# Patient Record
Sex: Male | Born: 1935 | Race: Asian | Hispanic: No | Marital: Married | Smoking: Never smoker
Health system: Southern US, Community
[De-identification: ages and names within clinical notes are randomized; demographics above are authoritative.]

## PROBLEM LIST (undated history)

## (undated) DIAGNOSIS — E119 Type 2 diabetes mellitus without complications: Secondary | ICD-10-CM

## (undated) DIAGNOSIS — M1990 Unspecified osteoarthritis, unspecified site: Secondary | ICD-10-CM

## (undated) DIAGNOSIS — C801 Malignant (primary) neoplasm, unspecified: Secondary | ICD-10-CM

## (undated) DIAGNOSIS — G510 Bell's palsy: Secondary | ICD-10-CM

## (undated) DIAGNOSIS — I10 Essential (primary) hypertension: Secondary | ICD-10-CM

## (undated) DIAGNOSIS — H269 Unspecified cataract: Secondary | ICD-10-CM

## (undated) DIAGNOSIS — N189 Chronic kidney disease, unspecified: Secondary | ICD-10-CM

## (undated) DIAGNOSIS — E7800 Pure hypercholesterolemia, unspecified: Secondary | ICD-10-CM

## (undated) HISTORY — PX: COLONOSCOPY: SHX174

## (undated) HISTORY — PX: TONSILLECTOMY: SUR1361

## (undated) HISTORY — PX: EYE SURGERY: SHX253

## (undated) HISTORY — PX: TYMPANOPLASTY: SHX33

---

## 2003-11-07 ENCOUNTER — Encounter: Admission: RE | Admit: 2003-11-07 | Discharge: 2003-11-07 | Payer: Self-pay | Admitting: Family Medicine

## 2009-06-18 ENCOUNTER — Emergency Department (HOSPITAL_COMMUNITY): Admission: EM | Admit: 2009-06-18 | Discharge: 2009-06-18 | Payer: Self-pay | Admitting: Emergency Medicine

## 2009-08-30 ENCOUNTER — Ambulatory Visit (HOSPITAL_COMMUNITY): Admission: RE | Admit: 2009-08-30 | Discharge: 2009-08-30 | Payer: Self-pay | Admitting: Urology

## 2009-09-07 ENCOUNTER — Ambulatory Visit: Admission: RE | Admit: 2009-09-07 | Discharge: 2009-12-04 | Payer: Self-pay | Admitting: Radiation Oncology

## 2010-01-12 ENCOUNTER — Ambulatory Visit (HOSPITAL_BASED_OUTPATIENT_CLINIC_OR_DEPARTMENT_OTHER): Admission: RE | Admit: 2010-01-12 | Discharge: 2010-01-12 | Payer: Self-pay | Admitting: Urology

## 2010-02-02 ENCOUNTER — Ambulatory Visit
Admission: RE | Admit: 2010-02-02 | Discharge: 2010-05-03 | Payer: Self-pay | Source: Home / Self Care | Admitting: Radiation Oncology

## 2010-09-08 LAB — POCT I-STAT 4, (NA,K, GLUC, HGB,HCT)
Glucose, Bld: 122 mg/dL — ABNORMAL HIGH (ref 70–99)
HCT: 41 % (ref 39.0–52.0)
Hemoglobin: 13.9 g/dL (ref 13.0–17.0)
Potassium: 3.4 mEq/L — ABNORMAL LOW (ref 3.5–5.1)
Sodium: 144 mEq/L (ref 135–145)

## 2010-09-09 LAB — CBC
HCT: 37.4 % — ABNORMAL LOW (ref 39.0–52.0)
Hemoglobin: 13.1 g/dL (ref 13.0–17.0)
MCH: 34.2 pg — ABNORMAL HIGH (ref 26.0–34.0)
MCHC: 35 g/dL (ref 30.0–36.0)
MCV: 97.7 fL (ref 78.0–100.0)
Platelets: 154 10*3/uL (ref 150–400)
RBC: 3.84 MIL/uL — ABNORMAL LOW (ref 4.22–5.81)
RDW: 15.4 % (ref 11.5–15.5)
WBC: 4.8 10*3/uL (ref 4.0–10.5)

## 2010-09-09 LAB — COMPREHENSIVE METABOLIC PANEL
ALT: 19 U/L (ref 0–53)
AST: 18 U/L (ref 0–37)
Albumin: 4.1 g/dL (ref 3.5–5.2)
Alkaline Phosphatase: 69 U/L (ref 39–117)
BUN: 28 mg/dL — ABNORMAL HIGH (ref 6–23)
CO2: 29 mEq/L (ref 19–32)
Calcium: 10.3 mg/dL (ref 8.4–10.5)
Chloride: 106 mEq/L (ref 96–112)
Creatinine, Ser: 1.8 mg/dL — ABNORMAL HIGH (ref 0.4–1.5)
GFR calc Af Amer: 45 mL/min — ABNORMAL LOW (ref >60)
GFR calc non Af Amer: 37 mL/min — ABNORMAL LOW (ref >60)
Glucose, Bld: 187 mg/dL — ABNORMAL HIGH (ref 70–99)
Potassium: 3.6 mEq/L (ref 3.5–5.1)
Sodium: 142 mEq/L (ref 135–145)
Total Bilirubin: 0.9 mg/dL (ref 0.3–1.2)
Total Protein: 6.8 g/dL (ref 6.0–8.3)

## 2010-09-09 LAB — PROTIME-INR
INR: 0.97 (ref 0.00–1.49)
Prothrombin Time: 12.8 seconds (ref 11.6–15.2)

## 2010-09-09 LAB — APTT: aPTT: 35 seconds (ref 24–37)

## 2010-09-24 LAB — CBC
Hemoglobin: 15.3 g/dL (ref 13.0–17.0)
MCHC: 34.5 g/dL (ref 30.0–36.0)
MCV: 99.1 fL (ref 78.0–100.0)
RBC: 4.46 MIL/uL (ref 4.22–5.81)
RDW: 14.5 % (ref 11.5–15.5)

## 2010-09-24 LAB — COMPREHENSIVE METABOLIC PANEL
ALT: 39 U/L (ref 0–53)
AST: 74 U/L — ABNORMAL HIGH (ref 0–37)
Albumin: 4 g/dL (ref 3.5–5.2)
Alkaline Phosphatase: 107 U/L (ref 39–117)
BUN: 37 mg/dL — ABNORMAL HIGH (ref 6–23)
Calcium: 9.1 mg/dL (ref 8.4–10.5)
Chloride: 104 mEq/L (ref 96–112)
Creatinine, Ser: 1.53 mg/dL — ABNORMAL HIGH (ref 0.4–1.5)
Total Protein: 6.7 g/dL (ref 6.0–8.3)

## 2011-05-22 ENCOUNTER — Encounter: Payer: Self-pay | Admitting: *Deleted

## 2011-05-22 NOTE — Progress Notes (Unsigned)
PROGRESS RESULTS SCORE=10 IIEF SCORES= [id number] ON 09/07/2009

## 2013-10-25 ENCOUNTER — Other Ambulatory Visit: Payer: Self-pay | Admitting: Family Medicine

## 2013-10-25 DIAGNOSIS — M4310 Spondylolisthesis, site unspecified: Secondary | ICD-10-CM

## 2013-10-30 ENCOUNTER — Ambulatory Visit
Admission: RE | Admit: 2013-10-30 | Discharge: 2013-10-30 | Disposition: A | Discharge status: Home / Self Care | Payer: Medicare Other | Source: Ambulatory Visit | Attending: Family Medicine | Admitting: Family Medicine

## 2013-10-30 DIAGNOSIS — M4310 Spondylolisthesis, site unspecified: Secondary | ICD-10-CM

## 2013-12-30 ENCOUNTER — Other Ambulatory Visit: Payer: Self-pay | Admitting: Neurosurgery

## 2014-01-13 ENCOUNTER — Encounter (HOSPITAL_COMMUNITY): Payer: Self-pay

## 2014-01-17 ENCOUNTER — Ambulatory Visit (HOSPITAL_COMMUNITY)
Admission: RE | Admit: 2014-01-17 | Discharge: 2014-01-17 | Disposition: A | Discharge status: Home / Self Care | Payer: Medicare Other | Source: Ambulatory Visit | Attending: Neurosurgery | Admitting: Neurosurgery

## 2014-01-17 ENCOUNTER — Encounter (HOSPITAL_COMMUNITY)
Admission: RE | Admit: 2014-01-17 | Discharge: 2014-01-17 | Disposition: A | Discharge status: Home / Self Care | Payer: Medicare Other | Source: Ambulatory Visit | Attending: Anesthesiology | Admitting: Anesthesiology

## 2014-01-17 ENCOUNTER — Encounter (HOSPITAL_COMMUNITY): Payer: Self-pay

## 2014-01-17 DIAGNOSIS — G510 Bell's palsy: Secondary | ICD-10-CM | POA: Insufficient documentation

## 2014-01-17 DIAGNOSIS — Z8546 Personal history of malignant neoplasm of prostate: Secondary | ICD-10-CM | POA: Insufficient documentation

## 2014-01-17 DIAGNOSIS — E119 Type 2 diabetes mellitus without complications: Secondary | ICD-10-CM | POA: Insufficient documentation

## 2014-01-17 DIAGNOSIS — I10 Essential (primary) hypertension: Secondary | ICD-10-CM | POA: Insufficient documentation

## 2014-01-17 DIAGNOSIS — M129 Arthropathy, unspecified: Secondary | ICD-10-CM | POA: Diagnosis not present

## 2014-01-17 DIAGNOSIS — N289 Disorder of kidney and ureter, unspecified: Secondary | ICD-10-CM | POA: Diagnosis not present

## 2014-01-17 DIAGNOSIS — Z01810 Encounter for preprocedural cardiovascular examination: Secondary | ICD-10-CM | POA: Diagnosis present

## 2014-01-17 DIAGNOSIS — E7800 Pure hypercholesterolemia, unspecified: Secondary | ICD-10-CM | POA: Insufficient documentation

## 2014-01-17 HISTORY — DX: Type 2 diabetes mellitus without complications: E11.9

## 2014-01-17 HISTORY — DX: Unspecified osteoarthritis, unspecified site: M19.90

## 2014-01-17 HISTORY — DX: Pure hypercholesterolemia, unspecified: E78.00

## 2014-01-17 HISTORY — DX: Malignant (primary) neoplasm, unspecified: C80.1

## 2014-01-17 HISTORY — DX: Bell's palsy: G51.0

## 2014-01-17 HISTORY — DX: Unspecified cataract: H26.9

## 2014-01-17 HISTORY — DX: Essential (primary) hypertension: I10

## 2014-01-17 HISTORY — DX: Chronic kidney disease, unspecified: N18.9

## 2014-01-17 LAB — CBC
HCT: 44 % (ref 39.0–52.0)
HEMOGLOBIN: 15.1 g/dL (ref 13.0–17.0)
MCH: 33.3 pg (ref 26.0–34.0)
MCHC: 34.3 g/dL (ref 30.0–36.0)
MCV: 97.1 fL (ref 78.0–100.0)
Platelets: 175 10*3/uL (ref 150–400)
RBC: 4.53 MIL/uL (ref 4.22–5.81)
RDW: 13.8 % (ref 11.5–15.5)
WBC: 5.6 10*3/uL (ref 4.0–10.5)

## 2014-01-17 LAB — BASIC METABOLIC PANEL
Anion gap: 15 (ref 5–15)
BUN: 32 mg/dL — AB (ref 6–23)
CALCIUM: 10.5 mg/dL (ref 8.4–10.5)
CO2: 22 mEq/L (ref 19–32)
Chloride: 100 mEq/L (ref 96–112)
Creatinine, Ser: 1.83 mg/dL — ABNORMAL HIGH (ref 0.50–1.35)
GFR calc Af Amer: 39 mL/min — ABNORMAL LOW (ref >90)
GFR, EST NON AFRICAN AMERICAN: 33 mL/min — AB (ref >90)
GLUCOSE: 114 mg/dL — AB (ref 70–99)
Potassium: 4.3 mEq/L (ref 3.7–5.3)
SODIUM: 137 meq/L (ref 137–147)

## 2014-01-17 LAB — SURGICAL PCR SCREEN
MRSA, PCR: NEGATIVE
Staphylococcus aureus: NEGATIVE

## 2014-01-17 NOTE — Progress Notes (Signed)
Pt's PCP is Dr. Kathryne Eriksson with [facility] in [place]. He denies chest pain or sob, denies cardiac history.

## 2014-01-17 NOTE — Pre-Procedure Instructions (Signed)
Ricky Holden  01/17/2014   Your procedure is scheduled on:  Monday, January 24, 2014 at 11:55 AM.   Report to [facility] Entrance "A" Admitting Office at 10:00 AM.   Call this number if you have problems the morning of surgery: [phone]   Remember:   Do not eat food or drink liquids after midnight Sunday, 01/23/14.   Take these medicines the morning of surgery with A SIP OF WATER: allopurinol (ZYLOPRIM), atenolol-chlorthalidone (TENORETIC)  Stop Celebrex as of today.     Do not wear jewelry.  Do not wear lotions, powders, or cologne. You may wear deodorant.  Men may shave face and neck.  Do not bring valuables to the hospital.  [facility] is not responsible                  for any belongings or valuables.               Contacts, dentures or bridgework may not be worn into surgery.  Leave suitcase in the car. After surgery it may be brought to your room.  For patients admitted to the hospital, discharge time is determined by your                treatment team.             Special Instructions: [place] - Preparing for Surgery  Before surgery, you can play an important role.  Because skin is not sterile, your skin needs to be as free of germs as possible.  You can reduce the number of germs on you skin by washing with CHG (chlorahexidine gluconate) soap before surgery.  CHG is an antiseptic cleaner which kills germs and bonds with the skin to continue killing germs even after washing.  Please DO NOT use if you have an allergy to CHG or antibacterial soaps.  If your skin becomes reddened/irritated stop using the CHG and inform your nurse when you arrive at Short Stay.  Do not shave (including legs and underarms) for at least 48 hours prior to the first CHG shower.  You may shave your face.  Please follow these instructions carefully:   1.  Shower with CHG Soap the night before surgery and the                                morning of Surgery.  2.  If you choose  to wash your hair, wash your hair first as usual with your       normal shampoo.  3.  After you shampoo, rinse your hair and body thoroughly to remove the                      Shampoo.  4.  Use CHG as you would any other liquid soap.  You can apply chg directly       to the skin and wash gently with scrungie or a clean washcloth.  5.  Apply the CHG Soap to your body ONLY FROM THE NECK DOWN.        Do not use on open wounds or open sores.  Avoid contact with your eyes, ears, mouth and genitals (private parts).  Wash genitals (private parts) with your normal soap.  6.  Wash thoroughly, paying special attention to the area where your surgery        will be performed.  7.  Thoroughly rinse your body with warm water from the neck down.  8.  DO NOT shower/wash with your normal soap after using and rinsing off       the CHG Soap.  9.  Pat yourself dry with a clean towel.            10.  Wear clean pajamas.            11.  Place clean sheets on your bed the night of your first shower and do not        sleep with pets.  Day of Surgery  Do not apply any lotions the morning of surgery.  Please wear clean clothes to the hospital/surgery center.     Please read over the following fact sheets that you were given: Pain Booklet, Coughing and Deep Breathing, MRSA Information and Surgical Site Infection Prevention

## 2014-01-17 NOTE — Progress Notes (Signed)
01/17/14 0950  OBSTRUCTIVE SLEEP APNEA  Have you ever been diagnosed with sleep apnea through a sleep study? No  Do you snore loudly (loud enough to be heard through closed doors)?  0  Do you often feel tired, fatigued, or sleepy during the daytime? 0  Has anyone observed you stop breathing during your sleep? 0  Do you have, or are you being treated for high blood pressure? 1  BMI more than 35 kg/m2? 0  Age over 78 years old? 1  Neck circumference greater than 40 cm/16 inches? 1  Gender: 1  Obstructive Sleep Apnea Score 4  Score 4 or greater  Results sent to PCP

## 2014-01-18 NOTE — Progress Notes (Addendum)
Anesthesia Chart Review:  Patient is a 80 year old male scheduled for C3-4, C4-5, C5-6 ACDF on 01/24/14 by Dr. Arnoldo Morale.  History includes non-smoker, HTN, DM2, hypercholesterolemia, left cataract extraction, arthritis, tonsillectomy, prostate cancer s/p radioactive seed implant 01/12/10 (Dr. Karsten Ro), Bell's Palsy. He has evidence of renal insuffiency labs dating back to at least 12/2009. OSA screening score is 4. PCP is Dr. Kathryne Eriksson with [facility].    EKG on 01/17/14 showed: NSR, LAD.  CXR on 01/17/14 showed: No acute abnormality noted.   Preoperative labs noted.  BUN/Cr 32/1.83.  Currently his last comparison labs are from 12/22/09 prior to his prostate surgery with BUN/Cr 28/1.80 at that time.  CBC WNL.  Glucose 114.  Based on currently available records, renal function appears stable. However, I've requested more recent comparison labs from Dr. Dois Davenport office. I've asked nursing staff to bring me these records once received. If labs appear stable then I would anticipate that he could proceed as planned.   George Hugh [facility] Short Stay Center/Anesthesiology Phone [phone] 01/18/2014 11:28 AM  Addendum: 01/19/2014 4:32 PM Comparison labs received from Dr. Dois Davenport office.  His last BUN/Cr there were 25/1.40 (specimen icteric-may interfere with results) on 03/31/13.  His active problem list by Dr. Redmond Pulling includes "disorder of kidney and ureter" but otherwise is non-specific regarding CKD history.  I did call and speak with Tammy at Dr. Dois Davenport office regarding patient's most recent BUN/Cr results. I also faxed the lab report with confirmation. Dr. Redmond Pulling is out of the office this week, but she will have one of the other practitioners review to ensure no additional recommendations.  For now, I'll order an ISTAT 8 to ensure no significant increase in his creatinine preoperatively.     Addendum: 01/21/2014 2:26 PM I had still not heard back from Dr. Dois Davenport office by this  morning, so I called again and refaxed labs. I then reviewed known information with anesthesiologist Dr. Marcie Bal who agreed that his PCP would need to sign off on labs and verify if this was a new or chronic issue.  I called back and spoke with nurse Pam who had spoken with Amy Boucherle, PA-C.  Amy had reviewed labs faxed and their office records dating back at least four years.  She said that during this time, his Cr has ranged between 1.4 - 2.0. His renal insufficiency has been a chronic issue, so no additional preoperative recommendations were made.

## 2014-01-21 ENCOUNTER — Encounter (HOSPITAL_COMMUNITY): Payer: Self-pay

## 2014-01-23 MED ORDER — CEFAZOLIN SODIUM-DEXTROSE 2-3 GM-% IV SOLR
2.0000 g | INTRAVENOUS | Status: AC
  Administered 2014-01-24: 2 g via INTRAVENOUS
  Filled 2014-01-23: qty 50

## 2014-01-24 ENCOUNTER — Inpatient Hospital Stay (HOSPITAL_COMMUNITY): Payer: Medicare Other | Admitting: Certified Registered Nurse Anesthetist

## 2014-01-24 ENCOUNTER — Encounter (HOSPITAL_COMMUNITY): Payer: Medicare Other | Admitting: Vascular Surgery

## 2014-01-24 ENCOUNTER — Inpatient Hospital Stay (HOSPITAL_COMMUNITY): Payer: Medicare Other

## 2014-01-24 ENCOUNTER — Encounter (HOSPITAL_COMMUNITY): Payer: Self-pay | Admitting: *Deleted

## 2014-01-24 ENCOUNTER — Encounter (HOSPITAL_COMMUNITY)
Admission: RE | Disposition: A | Discharge status: Home / Self Care | Payer: Self-pay | Source: Ambulatory Visit | Attending: Neurosurgery

## 2014-01-24 ENCOUNTER — Inpatient Hospital Stay (HOSPITAL_COMMUNITY)
Admission: RE | Admit: 2014-01-24 | Discharge: 2014-01-26 | DRG: 473 | Disposition: A | Discharge status: Home / Self Care | Payer: Medicare Other | Source: Ambulatory Visit | Attending: Neurosurgery | Admitting: Neurosurgery

## 2014-01-24 DIAGNOSIS — M4712 Other spondylosis with myelopathy, cervical region: Principal | ICD-10-CM | POA: Diagnosis present

## 2014-01-24 DIAGNOSIS — Q762 Congenital spondylolisthesis: Secondary | ICD-10-CM

## 2014-01-24 DIAGNOSIS — I129 Hypertensive chronic kidney disease with stage 1 through stage 4 chronic kidney disease, or unspecified chronic kidney disease: Secondary | ICD-10-CM | POA: Diagnosis present

## 2014-01-24 DIAGNOSIS — N189 Chronic kidney disease, unspecified: Secondary | ICD-10-CM | POA: Diagnosis present

## 2014-01-24 DIAGNOSIS — E7800 Pure hypercholesterolemia, unspecified: Secondary | ICD-10-CM | POA: Diagnosis present

## 2014-01-24 DIAGNOSIS — E119 Type 2 diabetes mellitus without complications: Secondary | ICD-10-CM | POA: Diagnosis present

## 2014-01-24 DIAGNOSIS — M542 Cervicalgia: Secondary | ICD-10-CM | POA: Diagnosis present

## 2014-01-24 DIAGNOSIS — Z800 Family history of malignant neoplasm of digestive organs: Secondary | ICD-10-CM

## 2014-01-24 HISTORY — PX: ANTERIOR CERVICAL DECOMP/DISCECTOMY FUSION: SHX1161

## 2014-01-24 LAB — POCT I-STAT, CHEM 8
BUN: 33 mg/dL — AB (ref 6–23)
CALCIUM ION: 1.33 mmol/L — AB (ref 1.13–1.30)
CHLORIDE: 102 meq/L (ref 96–112)
CREATININE: 1.8 mg/dL — AB (ref 0.50–1.35)
GLUCOSE: 115 mg/dL — AB (ref 70–99)
HCT: 46 % (ref 39.0–52.0)
Hemoglobin: 15.6 g/dL (ref 13.0–17.0)
Potassium: 4.1 mEq/L (ref 3.7–5.3)
Sodium: 140 mEq/L (ref 137–147)
TCO2: 23 mmol/L (ref 0–100)

## 2014-01-24 LAB — GLUCOSE, CAPILLARY
Glucose-Capillary: 184 mg/dL — ABNORMAL HIGH (ref 70–99)
Glucose-Capillary: 186 mg/dL — ABNORMAL HIGH (ref 70–99)

## 2014-01-24 SURGERY — ANTERIOR CERVICAL DECOMPRESSION/DISCECTOMY FUSION 3 LEVELS
Anesthesia: General

## 2014-01-24 MED ORDER — DOCUSATE SODIUM 100 MG PO CAPS
100.0000 mg | ORAL_CAPSULE | Freq: Two times a day (BID) | ORAL | Status: DC
  Administered 2014-01-25 – 2014-01-26 (×3): 100 mg via ORAL
  Filled 2014-01-24 (×4): qty 1

## 2014-01-24 MED ORDER — ATENOLOL-CHLORTHALIDONE 50-25 MG PO TABS: 0.5000 | ORAL_TABLET | Freq: Every day | ORAL | Status: DC

## 2014-01-24 MED ORDER — ALUM & MAG HYDROXIDE-SIMETH 200-200-20 MG/5ML PO SUSP
30.0000 mL | Freq: Four times a day (QID) | ORAL | Status: DC | PRN
Start: 2014-01-24 — End: 2014-01-26

## 2014-01-24 MED ORDER — ROCURONIUM BROMIDE 100 MG/10ML IV SOLN
INTRAVENOUS | Status: DC | PRN
  Administered 2014-01-24: 50 mg via INTRAVENOUS

## 2014-01-24 MED ORDER — FENOFIBRATE 160 MG PO TABS
160.0000 mg | ORAL_TABLET | Freq: Every day | ORAL | Status: DC
  Administered 2014-01-25 – 2014-01-26 (×2): 160 mg via ORAL
  Filled 2014-01-24 (×3): qty 1

## 2014-01-24 MED ORDER — ACETAMINOPHEN 650 MG RE SUPP: 650.0000 mg | RECTAL | Status: DC | PRN

## 2014-01-24 MED ORDER — ONDANSETRON HCL 4 MG/2ML IJ SOLN
INTRAMUSCULAR | Status: DC | PRN
  Administered 2014-01-24: 4 mg via INTRAVENOUS

## 2014-01-24 MED ORDER — CEFAZOLIN SODIUM-DEXTROSE 2-3 GM-% IV SOLR
2.0000 g | Freq: Three times a day (TID) | INTRAVENOUS | Status: AC
  Administered 2014-01-24 – 2014-01-25 (×2): 2 g via INTRAVENOUS
  Filled 2014-01-24 (×2): qty 50

## 2014-01-24 MED ORDER — ACETAMINOPHEN 325 MG PO TABS: 650.0000 mg | ORAL_TABLET | ORAL | Status: DC | PRN

## 2014-01-24 MED ORDER — BACITRACIN 50000 UNITS IM SOLR
INTRAMUSCULAR | Status: DC | PRN
  Administered 2014-01-24 (×2)

## 2014-01-24 MED ORDER — HYDROCODONE-ACETAMINOPHEN 5-325 MG PO TABS
1.0000 | ORAL_TABLET | ORAL | Status: DC | PRN
  Administered 2014-01-25: 1 via ORAL
  Filled 2014-01-24: qty 1

## 2014-01-24 MED ORDER — NEOSTIGMINE METHYLSULFATE 10 MG/10ML IV SOLN
INTRAVENOUS | Status: DC | PRN
  Administered 2014-01-24: 4 mg via INTRAVENOUS

## 2014-01-24 MED ORDER — BACITRACIN ZINC 500 UNIT/GM EX OINT
TOPICAL_OINTMENT | CUTANEOUS | Status: DC | PRN
  Administered 2014-01-24: 1 via TOPICAL

## 2014-01-24 MED ORDER — LACTATED RINGERS IV SOLN
INTRAVENOUS | Status: DC
  Administered 2014-01-24: 22:00:00 via INTRAVENOUS

## 2014-01-24 MED ORDER — HYDROMORPHONE HCL PF 1 MG/ML IJ SOLN: 0.2500 mg | INTRAMUSCULAR | Status: DC | PRN

## 2014-01-24 MED ORDER — ONDANSETRON HCL 4 MG/2ML IJ SOLN: 4.0000 mg | INTRAMUSCULAR | Status: DC | PRN

## 2014-01-24 MED ORDER — MIDAZOLAM HCL 5 MG/5ML IJ SOLN
INTRAMUSCULAR | Status: DC | PRN
  Administered 2014-01-24: 2 mg via INTRAVENOUS

## 2014-01-24 MED ORDER — ZOLPIDEM TARTRATE 5 MG PO TABS: 5.0000 mg | ORAL_TABLET | Freq: Every evening | ORAL | Status: DC | PRN

## 2014-01-24 MED ORDER — PROPOFOL 10 MG/ML IV BOLUS
INTRAVENOUS | Status: AC
  Filled 2014-01-24: qty 20

## 2014-01-24 MED ORDER — FENTANYL CITRATE 0.05 MG/ML IJ SOLN
INTRAMUSCULAR | Status: DC | PRN
  Administered 2014-01-24: 100 ug via INTRAVENOUS
  Administered 2014-01-24: 150 ug via INTRAVENOUS

## 2014-01-24 MED ORDER — ATORVASTATIN CALCIUM 10 MG PO TABS
20.0000 mg | ORAL_TABLET | Freq: Every day | ORAL | Status: DC
  Administered 2014-01-25 – 2014-01-26 (×2): 20 mg via ORAL
  Filled 2014-01-24: qty 1
  Filled 2014-01-24: qty 2
  Filled 2014-01-24: qty 1

## 2014-01-24 MED ORDER — DEXAMETHASONE SODIUM PHOSPHATE 10 MG/ML IJ SOLN
INTRAMUSCULAR | Status: DC | PRN
  Administered 2014-01-24: 10 mg via INTRAVENOUS

## 2014-01-24 MED ORDER — ALBUMIN HUMAN 5 % IV SOLN
INTRAVENOUS | Status: DC | PRN
  Administered 2014-01-24 (×2): via INTRAVENOUS

## 2014-01-24 MED ORDER — FENTANYL CITRATE 0.05 MG/ML IJ SOLN
INTRAMUSCULAR | Status: AC
  Filled 2014-01-24: qty 5

## 2014-01-24 MED ORDER — ROCURONIUM BROMIDE 50 MG/5ML IV SOLN
INTRAVENOUS | Status: AC
  Filled 2014-01-24: qty 1

## 2014-01-24 MED ORDER — LACTATED RINGERS IV SOLN: INTRAVENOUS | Status: DC | PRN

## 2014-01-24 MED ORDER — ONDANSETRON HCL 4 MG/2ML IJ SOLN: 4.0000 mg | Freq: Four times a day (QID) | INTRAMUSCULAR | Status: DC | PRN

## 2014-01-24 MED ORDER — OXYCODONE HCL 5 MG/5ML PO SOLN: 5.0000 mg | Freq: Once | ORAL | Status: DC | PRN

## 2014-01-24 MED ORDER — BUPIVACAINE-EPINEPHRINE (PF) 0.5% -1:200000 IJ SOLN
INTRAMUSCULAR | Status: DC | PRN
  Administered 2014-01-24: 10 mL

## 2014-01-24 MED ORDER — PHENYLEPHRINE HCL 10 MG/ML IJ SOLN
INTRAMUSCULAR | Status: DC | PRN
  Administered 2014-01-24: 40 ug via INTRAVENOUS
  Administered 2014-01-24 (×6): 80 ug via INTRAVENOUS

## 2014-01-24 MED ORDER — DIAZEPAM 5 MG PO TABS
5.0000 mg | ORAL_TABLET | Freq: Four times a day (QID) | ORAL | Status: DC | PRN
  Administered 2014-01-25: 5 mg via ORAL
  Filled 2014-01-24: qty 1

## 2014-01-24 MED ORDER — PHENOL 1.4 % MT LIQD
1.0000 | OROMUCOSAL | Status: DC | PRN
Start: 2014-01-24 — End: 2014-01-26

## 2014-01-24 MED ORDER — ALLOPURINOL 100 MG PO TABS
300.0000 mg | ORAL_TABLET | Freq: Every day | ORAL | Status: DC
  Administered 2014-01-25 – 2014-01-26 (×2): 300 mg via ORAL
  Filled 2014-01-24: qty 1
  Filled 2014-01-24: qty 3

## 2014-01-24 MED ORDER — SURGIFOAM 100 EX MISC
CUTANEOUS | Status: DC | PRN
  Administered 2014-01-24 (×2): via TOPICAL

## 2014-01-24 MED ORDER — ONDANSETRON HCL 4 MG/2ML IJ SOLN
INTRAMUSCULAR | Status: AC
  Filled 2014-01-24: qty 2

## 2014-01-24 MED ORDER — OXYCODONE-ACETAMINOPHEN 5-325 MG PO TABS
1.0000 | ORAL_TABLET | ORAL | Status: DC | PRN
  Administered 2014-01-25: 2 via ORAL
  Administered 2014-01-26: 1 via ORAL
  Filled 2014-01-24: qty 1
  Filled 2014-01-24: qty 2

## 2014-01-24 MED ORDER — PROPOFOL 10 MG/ML IV BOLUS
INTRAVENOUS | Status: DC | PRN
  Administered 2014-01-24: 150 mg via INTRAVENOUS

## 2014-01-24 MED ORDER — DEXAMETHASONE 4 MG PO TABS
4.0000 mg | ORAL_TABLET | Freq: Four times a day (QID) | ORAL | Status: AC
  Administered 2014-01-25: 4 mg via ORAL
  Filled 2014-01-24 (×4): qty 1

## 2014-01-24 MED ORDER — MIDAZOLAM HCL 2 MG/2ML IJ SOLN
INTRAMUSCULAR | Status: AC
  Filled 2014-01-24: qty 2

## 2014-01-24 MED ORDER — LIDOCAINE HCL (CARDIAC) 20 MG/ML IV SOLN
INTRAVENOUS | Status: DC | PRN
  Administered 2014-01-24: 80 mg via INTRAVENOUS

## 2014-01-24 MED ORDER — GLYCOPYRROLATE 0.2 MG/ML IJ SOLN
INTRAMUSCULAR | Status: DC | PRN
  Administered 2014-01-24: .8 mg via INTRAVENOUS

## 2014-01-24 MED ORDER — LIDOCAINE HCL (CARDIAC) 20 MG/ML IV SOLN
INTRAVENOUS | Status: AC
  Filled 2014-01-24: qty 5

## 2014-01-24 MED ORDER — EPHEDRINE SULFATE 50 MG/ML IJ SOLN
INTRAMUSCULAR | Status: DC | PRN
  Administered 2014-01-24 (×3): 10 mg via INTRAVENOUS

## 2014-01-24 MED ORDER — ATENOLOL 25 MG PO TABS
25.0000 mg | ORAL_TABLET | Freq: Every day | ORAL | Status: DC
  Administered 2014-01-25: 25 mg via ORAL
  Filled 2014-01-24 (×2): qty 1

## 2014-01-24 MED ORDER — MENTHOL 3 MG MT LOZG
1.0000 | LOZENGE | OROMUCOSAL | Status: DC | PRN
  Filled 2014-01-24: qty 9

## 2014-01-24 MED ORDER — OXYCODONE HCL 5 MG PO TABS: 5.0000 mg | ORAL_TABLET | Freq: Once | ORAL | Status: DC | PRN

## 2014-01-24 MED ORDER — 0.9 % SODIUM CHLORIDE (POUR BTL) OPTIME
TOPICAL | Status: DC | PRN
  Administered 2014-01-24: 1000 mL

## 2014-01-24 MED ORDER — LINAGLIPTIN 5 MG PO TABS
5.0000 mg | ORAL_TABLET | Freq: Every day | ORAL | Status: DC
  Administered 2014-01-25 – 2014-01-26 (×2): 5 mg via ORAL
  Filled 2014-01-24 (×3): qty 1

## 2014-01-24 MED ORDER — MORPHINE SULFATE 2 MG/ML IJ SOLN
1.0000 mg | INTRAMUSCULAR | Status: DC | PRN
  Administered 2014-01-24 – 2014-01-25 (×2): 2 mg via INTRAVENOUS
  Filled 2014-01-24 (×3): qty 1

## 2014-01-24 MED ORDER — HYDROCHLOROTHIAZIDE 12.5 MG PO CAPS
12.5000 mg | ORAL_CAPSULE | Freq: Every day | ORAL | Status: DC
  Administered 2014-01-25: 12.5 mg via ORAL
  Filled 2014-01-24 (×2): qty 1

## 2014-01-24 MED ORDER — INSULIN ASPART 100 UNIT/ML ~~LOC~~ SOLN
0.0000 [IU] | SUBCUTANEOUS | Status: DC
  Administered 2014-01-24 – 2014-01-25 (×3): 4 [IU] via SUBCUTANEOUS
  Administered 2014-01-25: 3 [IU] via SUBCUTANEOUS

## 2014-01-24 MED ORDER — SODIUM CHLORIDE 0.9 % IV SOLN
INTRAVENOUS | Status: DC
  Administered 2014-01-24 (×4): via INTRAVENOUS

## 2014-01-24 MED ORDER — DEXAMETHASONE SODIUM PHOSPHATE 4 MG/ML IJ SOLN
4.0000 mg | Freq: Four times a day (QID) | INTRAMUSCULAR | Status: AC
  Administered 2014-01-25 (×2): 4 mg via INTRAVENOUS
  Filled 2014-01-24 (×3): qty 1

## 2014-01-24 MED ORDER — ROPINIROLE HCL 1 MG PO TABS
0.5000 mg | ORAL_TABLET | Freq: Every day | ORAL | Status: DC
  Administered 2014-01-25: 0.5 mg via ORAL
  Filled 2014-01-24 (×2): qty 1
  Filled 2014-01-24: qty 0.5

## 2014-01-24 SURGICAL SUPPLY — 70 items
BAG DECANTER FOR FLEXI CONT (MISCELLANEOUS) ×3 IMPLANT
BENZOIN TINCTURE PRP APPL 2/3 (GAUZE/BANDAGES/DRESSINGS) ×3 IMPLANT
BIT DRILL NEURO 2X3.1 SFT TUCH (MISCELLANEOUS) ×1 IMPLANT
BLADE 10 SAFETY STRL DISP (BLADE) ×3 IMPLANT
BLADE SURG 15 STRL LF DISP TIS (BLADE) ×1 IMPLANT
BLADE SURG 15 STRL SS (BLADE) ×2
BLADE ULTRA TIP 2M (BLADE) ×3 IMPLANT
BRUSH SCRUB EZ PLAIN DRY (MISCELLANEOUS) ×3 IMPLANT
BUR BARREL STRAIGHT FLUTE 4.0 (BURR) ×3 IMPLANT
BUR MATCHSTICK NEURO 3.0 LAGG (BURR) ×3 IMPLANT
CANISTER SUCT 3000ML (MISCELLANEOUS) ×3 IMPLANT
CLOSURE WOUND 1/2 X4 (GAUZE/BANDAGES/DRESSINGS) ×1
CONT SPEC 4OZ CLIKSEAL STRL BL (MISCELLANEOUS) ×3 IMPLANT
COVER MAYO STAND STRL (DRAPES) ×3 IMPLANT
DEVICE FUSION VIST S 14X14X6MM (Trauma) ×1 IMPLANT
DRAIN JACKSON PRATT 10MM FLAT (MISCELLANEOUS) ×3 IMPLANT
DRAPE LAPAROTOMY 100X72 PEDS (DRAPES) ×3 IMPLANT
DRAPE MICROSCOPE LEICA (MISCELLANEOUS) IMPLANT
DRAPE POUCH INSTRU U-SHP 10X18 (DRAPES) ×3 IMPLANT
DRAPE SURG 17X23 STRL (DRAPES) ×6 IMPLANT
DRILL NEURO 2X3.1 SOFT TOUCH (MISCELLANEOUS) ×3
ELECT REM PT RETURN 9FT ADLT (ELECTROSURGICAL) ×3
ELECTRODE REM PT RTRN 9FT ADLT (ELECTROSURGICAL) ×1 IMPLANT
EVACUATOR SILICONE 100CC (DRAIN) ×3 IMPLANT
GAUZE SPONGE 4X4 12PLY STRL (GAUZE/BANDAGES/DRESSINGS) ×3 IMPLANT
GAUZE SPONGE 4X4 16PLY XRAY LF (GAUZE/BANDAGES/DRESSINGS) IMPLANT
GLOVE BIO SURGEON STRL SZ8.5 (GLOVE) ×6 IMPLANT
GLOVE BIOGEL PI IND STRL 8 (GLOVE) ×3 IMPLANT
GLOVE BIOGEL PI INDICATOR 8 (GLOVE) ×6
GLOVE ECLIPSE 7.5 STRL STRAW (GLOVE) ×9 IMPLANT
GLOVE EXAM NITRILE LRG STRL (GLOVE) IMPLANT
GLOVE EXAM NITRILE MD LF STRL (GLOVE) IMPLANT
GLOVE EXAM NITRILE XL STR (GLOVE) IMPLANT
GLOVE EXAM NITRILE XS STR PU (GLOVE) IMPLANT
GLOVE SS BIOGEL STRL SZ 8 (GLOVE) ×1 IMPLANT
GLOVE SS BIOGEL STRL SZ 8.5 (GLOVE) ×1 IMPLANT
GLOVE SUPERSENSE BIOGEL SZ 8 (GLOVE) ×2
GLOVE SUPERSENSE BIOGEL SZ 8.5 (GLOVE) ×2
GOWN STRL REUS W/ TWL LRG LVL3 (GOWN DISPOSABLE) IMPLANT
GOWN STRL REUS W/ TWL XL LVL3 (GOWN DISPOSABLE) ×3 IMPLANT
GOWN STRL REUS W/TWL 2XL LVL3 (GOWN DISPOSABLE) ×6 IMPLANT
GOWN STRL REUS W/TWL LRG LVL3 (GOWN DISPOSABLE)
GOWN STRL REUS W/TWL XL LVL3 (GOWN DISPOSABLE) ×6
KIT BASIN OR (CUSTOM PROCEDURE TRAY) ×3 IMPLANT
KIT ROOM TURNOVER OR (KITS) ×3 IMPLANT
MARKER SKIN DUAL TIP RULER LAB (MISCELLANEOUS) ×3 IMPLANT
NEEDLE HYPO 22GX1.5 SAFETY (NEEDLE) ×3 IMPLANT
NEEDLE SPNL 18GX3.5 QUINCKE PK (NEEDLE) ×3 IMPLANT
NS IRRIG 1000ML POUR BTL (IV SOLUTION) ×3 IMPLANT
PACK LAMINECTOMY NEURO (CUSTOM PROCEDURE TRAY) ×3 IMPLANT
PATTIES SURGICAL .5 X.5 (GAUZE/BANDAGES/DRESSINGS) ×9 IMPLANT
PATTIES SURGICAL 1X1 (DISPOSABLE) ×9 IMPLANT
PEEK VISTA 14X14X8MM (Peek) ×6 IMPLANT
PIN DISTRACTION 14MM (PIN) ×6 IMPLANT
PLATE ANT CERV XTEND 3 LV 51 (Plate) ×3 IMPLANT
PUTTY 5ML ACTIFUSE ABX (Putty) ×3 IMPLANT
RUBBERBAND STERILE (MISCELLANEOUS) IMPLANT
SCREW XTD VAR 4.2 SELF TAP (Screw) ×24 IMPLANT
SPONGE GAUZE 4X4 12PLY (GAUZE/BANDAGES/DRESSINGS) ×2 IMPLANT
SPONGE INTESTINAL PEANUT (DISPOSABLE) ×6 IMPLANT
SPONGE SURGIFOAM ABS GEL 100 (HEMOSTASIS) ×3 IMPLANT
STRIP CLOSURE SKIN 1/2X4 (GAUZE/BANDAGES/DRESSINGS) ×2 IMPLANT
SUT VIC AB 0 CT1 27 (SUTURE) ×2
SUT VIC AB 0 CT1 27XBRD ANTBC (SUTURE) ×1 IMPLANT
SUT VIC AB 3-0 SH 8-18 (SUTURE) ×3 IMPLANT
SYR 20ML ECCENTRIC (SYRINGE) ×3 IMPLANT
TOWEL OR 17X24 6PK STRL BLUE (TOWEL DISPOSABLE) ×3 IMPLANT
TOWEL OR 17X26 10 PK STRL BLUE (TOWEL DISPOSABLE) ×3 IMPLANT
VISTA S O 14X14X6MM (Trauma) ×3 IMPLANT
WATER STERILE IRR 1000ML POUR (IV SOLUTION) ×3 IMPLANT

## 2014-01-24 NOTE — Progress Notes (Signed)
Subjective:  The patient is somnolent but arousable. He is in no apparent distress.  Objective: Vital signs in last 24 hours: Temp:  [98.2 F (36.8 C)-98.5 F (36.9 C)] 98.2 F (36.8 C) (08/03 1816) Pulse Rate:  [68-96] 96 (08/03 1816) Resp:  [13-18] 13 (08/03 1816) BP: (104-114)/(62-79) 104/62 mmHg (08/03 1816) SpO2:  [98 %-100 %] 98 % (08/03 1816) Weight:  [77.111 kg (170 lb)] 77.111 kg (170 lb) (08/03 0923)  Intake/Output from previous day:   Intake/Output this shift: Total I/O In: 2000 [I.V.:1500; IV Piggyback:500] Out: 800 [Blood:800]  Physical exam  The patient is somnolent but arousable. He is moving all 4 extremities. His dressing is clean and dry. There is no evidence of hematoma or shift.  Lab Results:  Recent Labs  01/24/14 1030  HGB 15.6  HCT 46.0   BMET  Recent Labs  01/24/14 1030  NA 140  K 4.1  CL 102  GLUCOSE 115*  BUN 33*  CREATININE 1.80*    Studies/Results: Dg Cervical Spine 1 View  01/24/2014   CLINICAL DATA:  Three level anterior cervical fusion.  EXAM: DG CERVICAL SPINE - 1 VIEW  COMPARISON:  Cervical spine MR dated 12/22/2013.  FINDINGS: A single portable cross-table lateral view of the cervical spine demonstrate a metallic localizer with its tip projected over the anterior aspect of the C4-5 level. Multilevel degenerative changes.  IMPRESSION: Localizer at the C4-5 level.   Electronically Signed   By: Enrique Sack M.D.   On: 01/24/2014 15:08    Assessment/Plan: The patient is doing well.  LOS: 0 days     Roxy Filler D 01/24/2014, 6:30 PM

## 2014-01-24 NOTE — Anesthesia Preprocedure Evaluation (Addendum)
Anesthesia Evaluation  Patient identified by MRN, date of birth, ID band Patient awake    Reviewed: Allergy & Precautions, H&P , NPO status , Patient's Chart, lab work & pertinent test results  Airway Mallampati: II TM Distance: >3 FB Neck ROM: full    Dental  (+) Teeth Intact, Dental Advisory Given   Pulmonary neg pulmonary ROS,          Cardiovascular hypertension, Pt. on medications and Pt. on home beta blockers     Neuro/Psych    GI/Hepatic   Endo/Other  diabetes, Type 2, Oral Hypoglycemic Agents  Renal/GU Renal InsufficiencyRenal disease     Musculoskeletal  (+) Arthritis -, Osteoarthritis,    Abdominal   Peds  Hematology   Anesthesia Other Findings   Reproductive/Obstetrics                          Anesthesia Physical Anesthesia Plan  ASA: III  Anesthesia Plan: General   Post-op Pain Management:    Induction: Intravenous  Airway Management Planned: Oral ETT  Additional Equipment:   Intra-op Plan:   Post-operative Plan: Extubation in OR  Informed Consent: I have reviewed the patients History and Physical, chart, labs and discussed the procedure including the risks, benefits and alternatives for the proposed anesthesia with the patient or authorized representative who has indicated his/her understanding and acceptance.   Dental advisory given  Plan Discussed with: CRNA, Anesthesiologist and Surgeon  Anesthesia Plan Comments:        Anesthesia Quick Evaluation

## 2014-01-24 NOTE — Op Note (Signed)
Brief history: The patient is a 80 year old Asian male presented with a cervical myelopathy. He was worked up with a cervical MRI which demonstrated multilevel degenerative changes, spondylosis, stenosis and spinal cord signal change. I discussed the situation with the patient and his wife. I recommended surgery. The patient has weighed the risks, benefits, and alternatives surgery and decided proceed with a C3-4, C4-5 and C5-6 anterior cervical discectomy, fusion, and plating.  Preoperative diagnosis: C3-4, C4-5, C5-6 disc degeneration, spondylolisthesis, spondylosis, stenosis, cervicalgia, cervical adenopathy, cervical myelopathy, ossification of the posterior longitudinal ligament  Postoperative diagnosis: Same  Procedure: C3-4, C4-5 and C5-6 Anterior cervical discectomy/decompression; C3-4, C4-5 and C5-6 interbody arthrodesis with local morcellized autograft bone and Actifuse bone graft extender; insertion of interbody prosthesis at C3-4, C4-5 and C5-6 (Zimmer peek interbody prosthesis); anterior cervical plating from C3-C6 with globus titanium plate  Surgeon: Dr. Earle Gell  Asst.: Dr. Dayton Bailiff  Anesthesia: Gen. endotracheal  Estimated blood loss: 200 cc  Drains: None  Complications: None  Description of procedure: The patient was brought to the operating room by the anesthesia team. General endotracheal anesthesia was induced. A roll was placed under the patient's shoulders to keep the neck in the neutral position. The patient's anterior cervical region was then prepared with Betadine scrub and Betadine solution. Sterile drapes were applied.  The area to be incised was then injected with Marcaine with epinephrine solution. I then used a scalpel to make a transverse incision in the patient's left anterior neck. I used the Metzenbaum scissors to divide the platysmal muscle and then to dissect medial to the sternocleidomastoid muscle, jugular vein, and carotid artery. I carefully  dissected down towards the anterior cervical spine identifying the esophagus and retracting it medially. Then using Kitner swabs to clear soft tissue from the anterior cervical spine. We then inserted a bent spinal needle into the upper exposed intervertebral disc space. We then obtained intraoperative radiographs confirm our location.  I then used electrocautery to detach the medial border of the longus colli muscle bilaterally from the C3-4, C4-5 and C5-6 intervertebral disc spaces. I then inserted the Caspar self-retaining retractor underneath the longus colli muscle bilaterally to provide exposure.  We then incised the intervertebral disc at C4-5. We then performed a partial intervertebral discectomy with a pituitary forceps and the Karlin curettes. I then inserted distraction screws into the vertebral bodies at C4 and C5. We then distracted the interspace. We then used the high-speed drill to decorticate the vertebral endplates at Y8-1, to drill away the remainder of the intervertebral disc, to drill away some posterior spondylosis, and to thin out the posterior longitudinal ligament. We encountered ossification of the posterior longitudinal ligament with the spondylosis quite adherent to the dura. I then incised ligament with the arachnoid knife. We then removed the ligament with a Kerrison punches undercutting the vertebral endplates and decompressing the thecal sac. We then performed foraminotomies about the bilateral C5 nerve roots. This completed the decompression at this level.  We then repeated this procedure in an analogous fashion at C3-4 and C5-C6 decompressing the thecal sac at these levels as well as about C4 and C6 nerve roots.  We now turned our to attention to the interbody fusion. We used the trial spacers to determine the appropriate size for the interbody prosthesis. We then pre-filled prosthesis with a combination of local morcellized autograft bone that we obtained during  decompression as well as Actifuse bone graft extender. We then inserted the prosthesis into the distracted interspace at  C3-4, C4-5 and C5-6. We then removed the distraction screws. There was a good snug fit of the prosthesis in the interspace.  Having completed the fusion we now turned attention to the anterior spinal instrumentation. We used the high-speed drill to drill away some anterior spondylosis at the disc spaces so that the plate lay down flat. We selected the appropriate length titanium anterior cervical plate. We laid it along the anterior aspect of the vertebral bodies from C3-4, C4-5 and C5-6. We then drilled 14 mm holes at C3, C4, C5 and C6. We then secured the plate to the vertebral bodies by placing two 14 mm self-tapping screws at C3, C4, C5 and C6. We then obtained intraoperative radiograph. The demonstrating good position of the instrumentation. We therefore secured the screws the plate the locking each cam. This completed the instrumentation.  We then obtained hemostasis using bipolar electrocautery. We irrigated the wound out with bacitracin solution. We then removed the retractor. We inspected the esophagus for any damage. There was none apparent. We placed a 10 mm flat Hemovac drain in the prevertebral space and tunneled it out through a separate stab wound. We then reapproximated patient's platysmal muscle with interrupted 3-0 Vicryl suture. We then reapproximated the subcutaneous tissue with interrupted 3-0 Vicryl suture. The skin was reapproximated with Steri-Strips and benzoin. The wound was then covered with bacitracin ointment. A sterile dressing was applied. The drapes were removed. All sponge instrument and needle counts were reportedly correct at the end of this case.Marland Kitchen

## 2014-01-24 NOTE — Transfer of Care (Signed)
Immediate Anesthesia Transfer of Care Note  Patient: Ricky Holden  Procedure(s) Performed: Procedure(s): ANTERIOR CERVICAL DECOMPRESSION/DISCECTOMY FUSION 3 LEVELS Cervical three/four,four/five, five/six  anterior cervical decompression with fusion interbody prosthesis plating and bonegraft (N/A)  Patient Location: PACU  Anesthesia Type:General  Level of Consciousness: awake  Airway & Oxygen Therapy: Patient Spontanous Breathing and Patient connected to nasal cannula oxygen  Post-op Assessment: Report given to PACU RN and Post -op Vital signs reviewed and stable  Post vital signs: Reviewed and stable  Complications: No apparent anesthesia complications

## 2014-01-24 NOTE — Anesthesia Postprocedure Evaluation (Signed)
  Anesthesia Post-op Note  Patient: Ricky Holden  Procedure(s) Performed: Procedure(s): ANTERIOR CERVICAL DECOMPRESSION/DISCECTOMY FUSION 3 LEVELS Cervical three/four,four/five, five/six  anterior cervical decompression with fusion interbody prosthesis plating and bonegraft (N/A)  Patient Location: PACU  Anesthesia Type:General  Level of Consciousness: awake  Airway and Oxygen Therapy: Patient Spontanous Breathing  Post-op Pain: mild  Post-op Assessment: Post-op Vital signs reviewed  Post-op Vital Signs: Reviewed  Last Vitals:  Filed Vitals:   01/24/14 1845  BP: 115/71  Pulse: 102  Temp:   Resp: 14    Complications: No apparent anesthesia complications

## 2014-01-24 NOTE — H&P (Signed)
Subjective: The patient is a 80 year old Asian male who has presented with a cervical myelopathy. He was worked up with a cervical MRI which demonstrated significant spinal stenosis at C3-4, C4-5 and C5-6. I discussed the various treatment option with the patient including surgery. He has weighed the risks, benefits, and alternatives surgery and decided proceed with a three-level anterior cervical discectomy, fusion, and plating.   Of note the patient does have a lumbar spondylolisthesis with spinal stenosis.  Past Medical History  Diagnosis Date  . Cancer     Prostate cancer - radiation  . Hypertension   . High cholesterol   . Arthritis   . Bell's palsy   . Cataract     has had surgery  . Chronic kidney disease     Cr 1.40 - 2.0 from ~ 2010 - 2015  . Diabetes mellitus without complication     type 2    Past Surgical History  Procedure Laterality Date  . Eye surgery Left     cataract surgery with lens implant  . Tonsillectomy    . Tympanoplasty Left     wears hearing aids  . Colonoscopy      No Known Allergies  History  Substance Use Topics  . Smoking status: Never Smoker   . Smokeless tobacco: Never Used  . Alcohol Use: No    Family History  Problem Relation Age of Onset  . Colon cancer Mother    Prior to Admission medications   Medication Sig Start Date End Date Taking? Authorizing Provider  allopurinol (ZYLOPRIM) 300 MG tablet Take 300 mg by mouth daily.   Yes Historical Provider, MD  atenolol-chlorthalidone (TENORETIC) 50-25 MG per tablet Take 0.5 tablets by mouth daily.   Yes Historical Provider, MD  atorvastatin (LIPITOR) 40 MG tablet Take 20 mg by mouth daily.   Yes Historical Provider, MD  celecoxib (CELEBREX) 200 MG capsule Take 200 mg by mouth daily.   Yes Historical Provider, MD  fenofibrate 160 MG tablet Take 160 mg by mouth daily.   Yes Historical Provider, MD  linagliptin (TRADJENTA) 5 MG TABS tablet Take 5 mg by mouth daily.   Yes Historical Provider,  MD  rOPINIRole (REQUIP) 0.5 MG tablet Take 0.5 mg by mouth at bedtime.   Yes Historical Provider, MD     Review of Systems  Positive ROS: As above and the patient complains of back and leg pain  All other systems have been reviewed and were otherwise negative with the exception of those mentioned in the HPI and as above.  Objective: Vital signs in last 24 hours: Temp:  [98.5 F (36.9 C)] 98.5 F (36.9 C) (08/03 0923) Pulse Rate:  [68] 68 (08/03 0923) Resp:  [18] 18 (08/03 0923) BP: (114)/(79) 114/79 mmHg (08/03 0923) SpO2:  [100 %] 100 % (08/03 0923) Weight:  [77.111 kg (170 lb)] 77.111 kg (170 lb) (08/03 0923)  General Appearance: Alert, cooperative, no distress, Head: Normocephalic, without obvious abnormality, atraumatic Eyes: PERRL, conjunctiva/corneas clear, EOM's intact,    Ears: Normal  Throat: Normal  Neck: Supple, symmetrical, trachea midline, no adenopathy; thyroid: No enlargement/tenderness/nodules; no carotid bruit or JVD Back: Symmetric, no curvature, ROM normal, no CVA tenderness Lungs: Clear to auscultation bilaterally, respirations unlabored Heart: Regular rate and rhythm, no murmur, rub or gallop Abdomen: Soft, non-tender,, no masses, no organomegaly Extremities: Extremities normal, atraumatic, no cyanosis or edema Pulses: 2+ and symmetric all extremities Skin: Skin color, texture, turgor normal, no rashes or lesions  NEUROLOGIC:  Mental status: alert and oriented, no aphasia, good attention span, Fund of knowledge/ memory ok Motor Exam - the patient has weakness in his bilateral hands Sensory Exam - grossly normal Reflexes: The patient is hyperreflexic Gait - the patient walks with a myelopathic gait Cranial Nerves: I: smell Not tested  II: visual acuity  OS: Normal  OD: Normal   II: visual fields Full to confrontation  II: pupils Equal, round, reactive to light  III,VII: ptosis None  III,IV,VI: extraocular muscles  Full ROM  V: mastication Normal   V: facial light touch sensation  Normal  V,VII: corneal reflex  Present  VII: facial muscle function - upper  Normal  VII: facial muscle function - lower Normal  VIII: hearing Not tested  IX: soft palate elevation  Normal  IX,X: gag reflex Present  XI: trapezius strength  5/5  XI: sternocleidomastoid strength 5/5  XI: neck flexion strength  5/5  XII: tongue strength  Normal    Data Review Lab Results  Component Value Date   WBC 5.6 01/17/2014   HGB 15.6 01/24/2014   HCT 46.0 01/24/2014   MCV 97.1 01/17/2014   PLT 175 01/17/2014   Lab Results  Component Value Date   NA 140 01/24/2014   K 4.1 01/24/2014   CL 102 01/24/2014   CO2 22 01/17/2014   BUN 33* 01/24/2014   CREATININE 1.80* 01/24/2014   GLUCOSE 115* 01/24/2014   Lab Results  Component Value Date   INR 0.97 12/22/2009    Assessment/Plan: C3-4, C4-5 and C5-C6 spondylosis, stenosis, cervical myelopathy, cervicalgia, cervical radiculopathy: I discussed situation with the patient. I reviewed his MR scan with them and pointed out the abnormalities. We have discussed the various treatment options including surgery. I described the surgical treatment option of a C3-4, C4-5 and C5-C6 anterior cervical discectomy, fusion, and plating. I've shown him surgical models. We have discussed the risks, benefits, alternatives, and likelihood of achieving our goals with surgery. I have answered all the patient's, and his wife's, questions. He has decided to proceed with surgery.   Raven Furnas D 01/24/2014 1:03 PM

## 2014-01-24 NOTE — Anesthesia Procedure Notes (Signed)
Procedure Name: Intubation Date/Time: 01/24/2014 1:55 PM Performed by: Carney Living Pre-anesthesia Checklist: Patient identified, Emergency Drugs available, Suction available, Patient being monitored and Timeout performed Patient Re-evaluated:Patient Re-evaluated prior to inductionOxygen Delivery Method: Circle system utilized Preoxygenation: Pre-oxygenation with 100% oxygen Intubation Type: IV induction Ventilation: Mask ventilation without difficulty and Oral airway inserted - appropriate to patient size Tube type: Oral Tube size: 7.5 mm Number of attempts: 1 Airway Equipment and Method: Video-laryngoscopy Placement Confirmation: ETT inserted through vocal cords under direct vision,  positive ETCO2 and breath sounds checked- equal and bilateral Secured at: 22 cm Tube secured with: Tape Dental Injury: Teeth and Oropharynx as per pre-operative assessment  Comments: Glidescope utilized, DL X1 with glide, 7.5 ETT easily placed while maintaining neck in neutral position throughout intubation. VSS

## 2014-01-25 LAB — GLUCOSE, CAPILLARY
GLUCOSE-CAPILLARY: 144 mg/dL — AB (ref 70–99)
Glucose-Capillary: 142 mg/dL — ABNORMAL HIGH (ref 70–99)
Glucose-Capillary: 147 mg/dL — ABNORMAL HIGH (ref 70–99)
Glucose-Capillary: 203 mg/dL — ABNORMAL HIGH (ref 70–99)
Glucose-Capillary: 154 mg/dL — ABNORMAL HIGH (ref 70–99)

## 2014-01-25 MED ORDER — INSULIN ASPART 100 UNIT/ML ~~LOC~~ SOLN: 0.0000 [IU] | Freq: Every day | SUBCUTANEOUS | Status: DC

## 2014-01-25 MED ORDER — INSULIN ASPART 100 UNIT/ML ~~LOC~~ SOLN
0.0000 [IU] | Freq: Three times a day (TID) | SUBCUTANEOUS | Status: DC
Start: 2014-01-26 — End: 2014-01-26

## 2014-01-25 NOTE — Care Management Note (Signed)
    Page 1 of 1   01/25/2014     10:26:17 AM CARE MANAGEMENT NOTE 01/25/2014  Patient:  Ricky Holden, Ricky Holden   Account Number:  [id number]  Date Initiated:  01/25/2014  Documentation initiated by:  Marvetta Gibbons  Subjective/Objective Assessment:   Pt admitted s/p ANTERIOR CERVICAL DECOMPRESSION/DISCECTOMY FUSION 3 LEVELS     Action/Plan:   PTA pt lived at home alone- PT eval pending- NCM to follow for recommendations   Anticipated DC Date:  01/26/2014   Anticipated DC Plan:        [place]  CM consult      Choice offered to / List presented to:             Status of service:  In process, will continue to follow Medicare Important Message given?   (If response is "NO", the following Medicare IM given date fields will be blank) Date Medicare IM given:   Medicare IM given by:   Date Additional Medicare IM given:   Additional Medicare IM given by:    Discharge Disposition:    Per UR Regulation:  Reviewed for med. necessity/level of care/duration of stay  If discussed at [place] of Stay Meetings, dates discussed:    Comments:

## 2014-01-25 NOTE — Evaluation (Signed)
Physical Therapy Evaluation Patient Details Name: Ricky Holden MRN: [id number] DOB: [birth year] Today's Date: 01/25/2014   History of Present Illness  patient is a 80 yo male s/p 3 level ACDF  Clinical Impression  Patient demonstrates deficits in functional mobility as indicated below. Will need continued skilled PT to address deficits and maximize function. Will see as indicated and progress as tolerated.     Follow Up Recommendations Supervision/Assistance - 24 hour    Equipment Recommendations  None recommended by PT    Recommendations for Other Services       Precautions / Restrictions Precautions Precautions: Cervical;Fall Required Braces or Orthoses: Cervical Brace Cervical Brace: Hard collar      Mobility  Bed Mobility Overal bed mobility: Needs Assistance Bed Mobility: Rolling;Sidelying to Sit;Sit to Sidelying Rolling: Supervision Sidelying to sit: Min assist     Sit to sidelying: Min guard General bed mobility comments: VCs for technique and log roll  Transfers Overall transfer level: Needs assistance Equipment used: 1 person hand held assist Transfers: Sit to/from Stand Sit to Stand: Min assist            Ambulation/Gait Ambulation/Gait assistance: Min assist Ambulation Distance (Feet): 260 Feet Assistive device: 1 person hand held assist Gait Pattern/deviations: Decreased stride length;Step-through pattern;Drifts right/left;Narrow base of support Gait velocity:  (decreased) Gait velocity interpretation: Below normal speed for age/gender    Stairs            Wheelchair Mobility    Modified Rankin (Stroke Patients Only)       Balance Overall balance assessment: Needs assistance         Standing balance support: No upper extremity supported Standing balance-Leahy Scale: Fair Standing balance comment: some instability noted at baseline                             Pertinent Vitals/Pain 3/10 pain post morphine     Home Living Family/patient expects to be discharged to:: Private residence Living Arrangements: Spouse/significant other Available Help at Discharge: Family Type of Home: House Home Access: Stairs to enter Entrance Stairs-Rails: Can reach both Entrance Stairs-Number of Steps: 4 Home Layout: One level Home Equipment: [place] - 2 wheels;Cane - single point      Prior Function Level of Independence: Independent         Comments: used cane for ambulation     Hand Dominance   Dominant Hand: Right    Extremity/Trunk Assessment   Upper Extremity Assessment: Defer to OT evaluation           Lower Extremity Assessment: Overall WFL for tasks assessed         Communication   Communication: HOH (glasses all the time)  Cognition Arousal/Alertness: Awake/alert Behavior During Therapy: WFL for tasks assessed/performed Overall Cognitive Status: Within Functional Limits for tasks assessed                      General Comments      Exercises        Assessment/Plan    PT Assessment Patient needs continued PT services  PT Diagnosis Difficulty walking;Abnormality of gait;Generalized weakness;Acute pain   PT Problem List Decreased strength;Decreased range of motion;Decreased activity tolerance;Decreased balance;Decreased mobility;Pain  PT Treatment Interventions DME instruction;Gait training;Stair training;Functional mobility training;Therapeutic activities;Therapeutic exercise;Balance training;Patient/family education   PT Goals (Current goals can be found in the Care Plan section) Acute Rehab PT Goals Patient Stated Goal: to get arm  working PT Goal Formulation: With patient/family Time For Goal Achievement: 02/08/14 Potential to Achieve Goals: Good    Frequency Min 4X/week   Barriers to discharge        Co-evaluation               End of Session Equipment Utilized During Treatment: Gait belt;Cervical collar Activity Tolerance: Patient  tolerated treatment well;Patient limited by pain Patient left: in bed;with call bell/phone within reach;with bed alarm set;with family/visitor present Nurse Communication: Mobility status         Time: 1859-0931 PT Time Calculation (min): 26 min   Charges:   PT Evaluation $Initial PT Evaluation Tier I: 1 Procedure PT Treatments $Gait Training: 8-22 mins $Therapeutic Activity: 8-22 mins   PT G CodesDuncan Dull 01/25/2014, 3:52 PM Alben Deeds, [place] DPT  [phone]

## 2014-01-25 NOTE — Progress Notes (Signed)
Patient received to room via wheelchair.  Wife present at side.  Patient able to transfer to bedside with moderate assist.  Patient has c-collar in place.  Dressing in place with small amount of blood at removal site of JP drain removed earlier.  No other blood noted on dressing.  Patient is A & O x 4.  No alterations noted to facial expressions or speech.  Patient was up to bathroom; attempting to use his cane.  Found he didn't have the strength or coordination to utilize the cane. Patient able to ambulate with one moderate assist. Vitals obtained and patient and family oriented to room. No c/o discomfort verbalized at this time.

## 2014-01-25 NOTE — Progress Notes (Signed)
Utilization review completed.  

## 2014-01-25 NOTE — Progress Notes (Signed)
Patient ID: Ricky Holden, male   DOB: [birth year], 80 y.o.   MRN: [id number] Subjective:  The patient is alert and pleasant. He looks well. He is in no apparent distress.  Objective: Vital signs in last 24 hours: Temp:  [97.5 F (36.4 C)-98.5 F (36.9 C)] 97.6 F (36.4 C) (08/04 0302) Pulse Rate:  [68-102] 102 (08/04 0302) Resp:  [12-20] 14 (08/04 0302) BP: (94-115)/(54-79) 100/60 mmHg (08/04 0302) SpO2:  [97 %-100 %] 100 % (08/04 0302) Weight:  [77.111 kg (170 lb)] 77.111 kg (170 lb) (08/03 0923)  Intake/Output from previous day: 08/03 0701 - 08/04 0700 In: 2170 [I.V.:1620; IV Piggyback:550] Out: 9444 [Urine:975; Drains:50; Blood:800] Intake/Output this shift:    Physical exam patient is alert and oriented. His strength is grossly normal except he has weakness in his bilateral deltoids. His dressing is clean and dry. There is no evidence of hematoma or shift. I removed the drain.  Lab Results:  Recent Labs  01/24/14 1030  HGB 15.6  HCT 46.0   BMET  Recent Labs  01/24/14 1030  NA 140  K 4.1  CL 102  GLUCOSE 115*  BUN 33*  CREATININE 1.80*    Studies/Results: Dg Cervical Spine 1 View  01/24/2014   CLINICAL DATA:  Three level anterior cervical fusion.  EXAM: DG CERVICAL SPINE - 1 VIEW  COMPARISON:  Cervical spine MR dated 12/22/2013.  FINDINGS: A single portable cross-table lateral view of the cervical spine demonstrate a metallic localizer with its tip projected over the anterior aspect of the C4-5 level. Multilevel degenerative changes.  IMPRESSION: Localizer at the C4-5 level.   Electronically Signed   By: Enrique Sack M.D.   On: 01/24/2014 15:08   Dg Cervical Spine 2-3 Views  01/24/2014   CLINICAL DATA:  Cervical fusion.  EXAM: CERVICAL SPINE - 2-3 VIEW  COMPARISON:  Earlier today.  FINDINGS: Anterior cervical screws at the C5 and C6 levels. Interval widening of the C4-5 disc space containing linear densities. Previously noted degenerative changes.  IMPRESSION:  Cervical screws at the C5 and C6 levels.   Electronically Signed   By: Enrique Sack M.D.   On: 01/24/2014 19:05    Assessment/Plan: Postop day #1: The patient is doing well. We will mobilize him with PT/OT. He likely go home tomorrow.  LOS: 1 day     Annslee Tercero D 01/25/2014, 7:26 AM

## 2014-01-25 NOTE — Progress Notes (Signed)
Patient noted to have blood saturation padding to his c collar.  This nurse applied reinforcement over existing surgical dressing and replaced piece of padding in the collar.  Changed his gown while changing his pad.  Patient tolerated procedure without difficulty. Will continue to monitor.

## 2014-01-25 NOTE — Evaluation (Signed)
Occupational Therapy Evaluation Patient Details Name: Ricky Holden MRN: [id number] DOB: 1932-09-27 Today's Date: 01/25/2014    History of Present Illness patient is a 80 yo male s/p 3 level ACDF   Clinical Impression   Pt s/p above. Pt independent with ADLs, PTA. Feel pt will benefit from acute OT to increase independence prior to d/c. Recommending CIR for consult. Pt with significant weakness in bilateral UE's and required Mod A for ambulation during evaluation.     Follow Up Recommendations  CIR    Equipment Recommendations  None recommended by OT    Recommendations for Other Services       Precautions / Restrictions Precautions Precautions: Cervical;Fall Required Braces or Orthoses: Cervical Brace Cervical Brace: Hard collar      Mobility Bed Mobility Overal bed mobility: Needs Assistance Bed Mobility: Rolling;Sidelying to Sit;Sit to Sidelying Rolling: Mod assist;Min guard Sidelying to sit: Mod assist     Sit to sidelying: Min assist General bed mobility comments: Cues for technique. Assistance with trunk to go from sidelying to sitting position.  Transfers Overall transfer level: Needs assistance Equipment used: 1 person hand held assist Transfers: Sit to/from Stand Sit to Stand: Min guard;Min assist                   ADL Overall ADL's : Needs assistance/impaired                 Upper Body Dressing : Maximal assistance;Sitting   Lower Body Dressing: Moderate assistance;Sit to/from stand   Toilet Transfer: Moderate assistance;Ambulation;Comfort height toilet;Grab bars (hand held assist)           Functional mobility during ADLs: Moderate assistance (hand held assist) General ADL Comments: Educated on cervical collar to pt's wife. Educated on use of cup for teeth care and use of straw. Pt very unsteady on feet. Practiced toilet transfer. Performed exercises in bed for UE's.  Discussed use of button up shirts.      Vision                      Perception     Praxis      Pertinent Vitals/Pain Pain 3/10. Repositioned.      Hand Dominance Right   Extremity/Trunk Assessment Upper Extremity Assessment Upper Extremity Assessment: RUE deficits/detail;LUE deficits/detail RUE Deficits / Details: 2-/5 shoulder flexion LUE Deficits / Details: 2-/5 shoulder flexion   Lower Extremity Assessment Lower Extremity Assessment: Defer to PT evaluation       Communication Communication Communication:  (HOH-has hearing aid)   Cognition Arousal/Alertness: Awake/alert Behavior During Therapy: WFL for tasks assessed/performed Overall Cognitive Status: Within Functional Limits for tasks assessed                     General Comments       Exercises Exercises: Other exercises Other Exercises Other Exercises: performed 10 reps AAROM shoulder flexion on both right and left with pt in supine position and head supported on pillow.   Shoulder Instructions      Home Living Family/patient expects to be discharged to:: Private residence Living Arrangements: Spouse/significant other Available Help at Discharge: Family Type of Home: House Home Access: Stairs to enter Technical brewer of Steps: 4 Entrance Stairs-Rails: Can reach both [place]: One level     Bathroom Shower/Tub: Occupational psychologist: Handicapped height     Home Equipment: Environmental consultant - 2 wheels;Cane - single point;Grab bars - toilet;Grab bars - tub/shower;Shower  seat          Prior Functioning/Environment Level of Independence: Independent        Comments: used cane for ambulation    OT Diagnosis: Acute pain;Generalized weakness   OT Problem List: Decreased strength;Impaired balance (sitting and/or standing);Decreased knowledge of use of DME or AE;Decreased knowledge of precautions;Pain;Impaired UE functional use   OT Treatment/Interventions: Self-care/ADL training;DME and/or AE instruction;Therapeutic  activities;Patient/family education;Balance training;Therapeutic exercise    OT Goals(Current goals can be found in the care plan section) Acute Rehab OT Goals Patient Stated Goal: not stated OT Goal Formulation: With patient Time For Goal Achievement: 02/01/14 Potential to Achieve Goals: Good ADL Goals Pt Will Perform Grooming: with set-up;with supervision;standing Pt Will Transfer to Toilet: with supervision;ambulating;grab bars (elevated commode) Pt Will Perform Toileting - Clothing Manipulation and hygiene: with supervision;sit to/from stand Additional ADL Goal #1: Pt/family will be independent with HEP for UE's. Additional ADL Goal #2: Pt's family will be independent in assisting with ADLs as needed.  OT Frequency: Min 2X/week   Barriers to D/C:            Co-evaluation              End of Session Equipment Utilized During Treatment: Gait belt;Cervical collar Nurse Communication: Other (comment) (bleeding from neck)  Activity Tolerance: Patient tolerated treatment well Patient left: in chair;with call bell/phone within reach;with family/visitor present   Time: 0962-8366 OT Time Calculation (min): 25 min Charges:  OT General Charges $OT Visit: 1 Procedure OT Evaluation $Initial OT Evaluation Tier I: 1 Procedure OT Treatments $Therapeutic Exercise: 8-22 mins G-CodesBenito Mccreedy OTR/L [phone] 01/25/2014, 5:59 PM

## 2014-01-25 NOTE — Progress Notes (Signed)
Pt report called to 4N. VSS. All meds are current to time. No c/o pain at this time.  Belongs are packed to be sent with patient to new room. Wife is at bedside.  Notified elink and CMT. Will continue to monitor.

## 2014-01-26 LAB — GLUCOSE, CAPILLARY: Glucose-Capillary: 131 mg/dL — ABNORMAL HIGH (ref 70–99)

## 2014-01-26 MED ORDER — DSS 100 MG PO CAPS: 100.0000 mg | ORAL_CAPSULE | Freq: Two times a day (BID) | ORAL | Status: AC

## 2014-01-26 MED ORDER — OXYCODONE-ACETAMINOPHEN 5-325 MG PO TABS: 1.0000 | ORAL_TABLET | ORAL | Status: DC | PRN

## 2014-01-26 NOTE — Progress Notes (Signed)
Received request for prescreen for inpatient rehab from OT. Reviewed pt's case and made note that DC summary and orders have been written as of [phone] this am.   Per Dr. Adline Mango DC summary: "On postop day #2 the patient requested discharge to home. He was given oral and written discharge instructions."  I spoke with Loma Sousa, case manager who stated that the plan does appear to be for the pt to go home later today.  If circumstances change and pt does not go home, we would consider possible inpatient rehab consult. No rehab consult needed now though in light of the plan to discharge home.  Thanks.  Nanetta Batty, PT Rehabilitation Admissions Coordinator [phone]

## 2014-01-26 NOTE — Discharge Summary (Signed)
Physician Discharge Summary  Patient ID: Ricky Holden MRN: [id number] DOB/AGE: 02/16/1933 80 y.o.  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 The surgery went well.  The patient's postoperative course was unremarkable. He did have some bilateral deltoid weakness. He was instructed on a home range of motion program and offered outpatient physical therapy. He declined.  On postop day #2 the patient requested discharge to home. He was given oral and written discharge instructions. All his questions were answered.  Consults: PT Significant Diagnostic Studies: None Treatments: C3-4, C4-5 and C5-C6 anterior cervical discectomy, fusion, and plating. Discharge Exam: Blood pressure 112/68, pulse 88, temperature 97.9 F (36.6 C), temperature source Oral, resp. rate 18, height 5\' 6"  (1.676 m), weight 77.111 kg (170 lb), SpO2 98.00%. The patient is alert and pleasant. His dressing has some old bloodstain. There is no evidence of hematoma or shift. His strength is normal except he has weakness in bilateral deltoids.  Disposition: Home  Discharge Instructions   Call MD for:  difficulty breathing, headache or visual disturbances    Complete by:  As directed      Call MD for:  extreme fatigue    Complete by:  As directed      Call MD for:  hives    Complete by:  As directed      Call MD for:  persistant dizziness or light-headedness    Complete by:  As directed      Call MD for:  persistant nausea and vomiting    Complete by:  As directed      Call MD for:   redness, tenderness, or signs of infection (pain, swelling, redness, odor or green/yellow discharge around incision site)    Complete by:  As directed      Call MD for:  severe uncontrolled pain    Complete by:  As directed      Call MD for:  temperature >100.4    Complete by:  As directed      Diet - low sodium heart healthy    Complete by:  As directed      Discharge instructions    Complete by:  As directed   Call [phone] for a followup appointment. Take a stool softener while you are using pain medications.     Driving Restrictions    Complete by:  As directed   Do not drive for 2 weeks.     Increase activity slowly    Complete by:  As directed      Lifting restrictions    Complete by:  As directed   Do not lift more than 5 pounds. No excessive bending or twisting.     May shower / Bathe    Complete by:  As directed   He may shower after the pain she is removed 3 days after surgery. Leave the incision alone.     Remove dressing in 24 hours    Complete by:  As directed             Medication List    STOP taking these medications       celecoxib 200 MG capsule  Commonly  known as:  CELEBREX      TAKE these medications       allopurinol 300 MG tablet  Commonly known as:  ZYLOPRIM  Take 300 mg by mouth daily.     atenolol-chlorthalidone 50-25 MG per tablet  Commonly known as:  TENORETIC  Take 0.5 tablets by mouth daily.     atorvastatin 40 MG tablet  Commonly known as:  LIPITOR  Take 20 mg by mouth daily.     DSS 100 MG Caps  Take 100 mg by mouth 2 (two) times daily.     fenofibrate 160 MG tablet  Take 160 mg by mouth daily.     linagliptin 5 MG Tabs tablet  Commonly known as:  TRADJENTA  Take 5 mg by mouth daily.     oxyCODONE-acetaminophen 5-325 MG per tablet  Commonly known as:  PERCOCET/ROXICET  Take 1-2 tablets by mouth every 4 (four) hours as needed for moderate pain.     rOPINIRole 0.5 MG tablet  Commonly known as:  REQUIP  Take 0.5 mg by  mouth at bedtime.         SignedOphelia Charter 01/26/2014, 8:41 AM

## 2014-01-26 NOTE — Progress Notes (Signed)
Discharge instructions reviewed with patient and family. RX given to pt. All questions answered at this time.   Ave Filter, RN

## 2014-01-27 ENCOUNTER — Encounter (HOSPITAL_COMMUNITY): Payer: Self-pay | Admitting: Neurosurgery

## 2014-05-30 ENCOUNTER — Other Ambulatory Visit (HOSPITAL_COMMUNITY): Payer: Self-pay | Admitting: Neurosurgery

## 2014-06-21 NOTE — Pre-Procedure Instructions (Addendum)
Ricky Holden  06/21/2014   Your procedure is scheduled on:  Thursday, Jan. 7th   Report to [facility] Admitting at 5:30 AM.  Call this number if you have problems the morning of surgery: [phone]   Remember:   Do not eat food or drink liquids after midnight Wednesday.    Take these medicines the morning of surgery with A SIP OF WATER: Atentolol-chlorthalidone, Oxycodone   STOP Celebrex today   STOP/ Do not take Aspirin, Aleve, Naproxen, Advil, Ibuprofen, Motrin, Vitamins, Herbs, or Supplements starting today   Do not wear jewelry - no rings or watches.  Do not wear lotions or colognes.   You may NOT  wear deodorant the morning of surgery.   Men may shave face and neck.   Do not bring valuables to the hospital.  [facility] is not responsible for any belongings or valuables.               Contacts, dentures or bridgework may not be worn into surgery.  Leave suitcase in the car. After surgery it may be brought to your room.  For patients admitted to the hospital, discharge time is determined by your treatment team.    Name and phone number of your driver:    Special Instructions: "Preparing for Surgery" instruction sheet.   Please read over the following fact sheets that you were given: Pain Booklet, Coughing and Deep Breathing, Blood Transfusion Information, MRSA Information and Surgical Site Infection Prevention

## 2014-06-22 ENCOUNTER — Encounter (HOSPITAL_COMMUNITY)
Admission: RE | Admit: 2014-06-22 | Discharge: 2014-06-22 | Disposition: A | Discharge status: Home / Self Care | Payer: Medicare Other | Source: Ambulatory Visit | Attending: Neurosurgery | Admitting: Neurosurgery

## 2014-06-22 ENCOUNTER — Encounter (HOSPITAL_COMMUNITY): Payer: Self-pay

## 2014-06-22 DIAGNOSIS — Z01818 Encounter for other preprocedural examination: Secondary | ICD-10-CM | POA: Insufficient documentation

## 2014-06-22 DIAGNOSIS — E780 Pure hypercholesterolemia: Secondary | ICD-10-CM | POA: Insufficient documentation

## 2014-06-22 DIAGNOSIS — E119 Type 2 diabetes mellitus without complications: Secondary | ICD-10-CM | POA: Insufficient documentation

## 2014-06-22 DIAGNOSIS — M1990 Unspecified osteoarthritis, unspecified site: Secondary | ICD-10-CM | POA: Insufficient documentation

## 2014-06-22 DIAGNOSIS — Z981 Arthrodesis status: Secondary | ICD-10-CM | POA: Insufficient documentation

## 2014-06-22 DIAGNOSIS — Z923 Personal history of irradiation: Secondary | ICD-10-CM | POA: Insufficient documentation

## 2014-06-22 DIAGNOSIS — C61 Malignant neoplasm of prostate: Secondary | ICD-10-CM | POA: Diagnosis not present

## 2014-06-22 DIAGNOSIS — I129 Hypertensive chronic kidney disease with stage 1 through stage 4 chronic kidney disease, or unspecified chronic kidney disease: Secondary | ICD-10-CM | POA: Insufficient documentation

## 2014-06-22 DIAGNOSIS — G510 Bell's palsy: Secondary | ICD-10-CM | POA: Insufficient documentation

## 2014-06-22 DIAGNOSIS — N189 Chronic kidney disease, unspecified: Secondary | ICD-10-CM | POA: Insufficient documentation

## 2014-06-22 LAB — SURGICAL PCR SCREEN
MRSA, PCR: NEGATIVE
Staphylococcus aureus: NEGATIVE

## 2014-06-22 LAB — CBC
HEMATOCRIT: 40.1 % (ref 39.0–52.0)
Hemoglobin: 13.2 g/dL (ref 13.0–17.0)
MCH: 31.7 pg (ref 26.0–34.0)
MCHC: 32.9 g/dL (ref 30.0–36.0)
MCV: 96.4 fL (ref 78.0–100.0)
Platelets: 158 10*3/uL (ref 150–400)
RBC: 4.16 MIL/uL — ABNORMAL LOW (ref 4.22–5.81)
RDW: 13.9 % (ref 11.5–15.5)
WBC: 4.3 10*3/uL (ref 4.0–10.5)

## 2014-06-22 LAB — TYPE AND SCREEN
ABO/RH(D): O POS
Antibody Screen: NEGATIVE

## 2014-06-22 LAB — BASIC METABOLIC PANEL
ANION GAP: 9 (ref 5–15)
BUN: 34 mg/dL — AB (ref 6–23)
CO2: 23 mmol/L (ref 19–32)
Calcium: 9.9 mg/dL (ref 8.4–10.5)
Chloride: 106 mEq/L (ref 96–112)
Creatinine, Ser: 2.12 mg/dL — ABNORMAL HIGH (ref 0.50–1.35)
GFR calc Af Amer: 32 mL/min — ABNORMAL LOW (ref >90)
GFR calc non Af Amer: 28 mL/min — ABNORMAL LOW (ref >90)
Glucose, Bld: 164 mg/dL — ABNORMAL HIGH (ref 70–99)
Potassium: 3.6 mmol/L (ref 3.5–5.1)
Sodium: 138 mmol/L (ref 135–145)

## 2014-06-22 LAB — ABO/RH: ABO/RH(D): O POS

## 2014-06-22 NOTE — Progress Notes (Signed)
   06/22/14 1001  OBSTRUCTIVE SLEEP APNEA  Have you ever been diagnosed with sleep apnea through a sleep study? No  Do you snore loudly (loud enough to be heard through closed doors)?  0  Do you often feel tired, fatigued, or sleepy during the daytime? 0  Has anyone observed you stop breathing during your sleep? 0  Do you have, or are you being treated for high blood pressure? 1  BMI more than 35 kg/m2? 0  Age over 78 years old? 1  Neck circumference greater than 40 cm/16 inches? 1  Gender: 1  Obstructive Sleep Apnea Score 4  Score 4 or greater  Results sent to PCP

## 2014-06-22 NOTE — Progress Notes (Signed)
Patient denies CP, Shob, cardiac test, PCP Dr. Kathryne Eriksson

## 2014-06-23 ENCOUNTER — Encounter (HOSPITAL_COMMUNITY): Payer: Self-pay

## 2014-06-23 NOTE — Progress Notes (Addendum)
Anesthesia Chart Review: Patient is a 80 year old male scheduled for L3-4, L4-5 laminectomies with PLIF on 06/30/14 by Dr. Arnoldo Morale.  History includes non-smoker, HTN, DM2, CKD (baseline ~ 1.4 - 2.0 since ~ 2011), hypercholesterolemia, left cataract extraction, arthritis, tonsillectomy, prostate cancer s/p radioactive seed implant 01/12/10 (Dr. Karsten Ro), C3-6 ACDF 01/24/14, Bell's Palsy. OSA screening score is 4. PCP is Dr. Kathryne Eriksson with [facility].   Meds include Norco, allopurinol, Tenoretic, Lipitor, Celebrex, Tradjenta, fenofibrate, Requip, Percocet.  EKG on 01/17/14 showed: NSR, LAD.  CXR on 01/17/14 showed: No acute abnormality noted.   Preoperative labs noted. BUN/Cr 34/2.12. Cr 4 months ago 1.80-1.83.  I called and spoke with Linton Rump, PA-C with Dr. Redmond Pulling prior to his 01/2014 surgery about his renal function.  She confirmed that he had known CKD with baseline Cr 1.4 -2.0 for at least the past 4 years and did not have any additional recommendations at that time. I did route BMET results to Dr. Redmond Pulling.  His Celebrex is now on hold. Since his Cr is on the higher side of his known baseline, I'll order an ISTAT8 on arrival to ensure stability.  I notified Manuela Schwartz at Dr. Arnoldo Morale' office of above.  [facility] Short Stay Center/Anesthesiology Phone [phone] 06/23/2014 11:10 AM

## 2014-06-29 MED ORDER — CEFAZOLIN SODIUM-DEXTROSE 2-3 GM-% IV SOLR
2.0000 g | INTRAVENOUS | Status: AC
  Administered 2014-06-30: 2 g via INTRAVENOUS
  Filled 2014-06-29: qty 50

## 2014-06-29 NOTE — Anesthesia Preprocedure Evaluation (Addendum)
Anesthesia Evaluation  Patient identified by MRN, date of birth, ID band Patient awake    Reviewed: Allergy & Precautions, NPO status , Patient's Chart, lab work & pertinent test results, reviewed documented beta blocker date and time   Airway Mallampati: II       Dental  (+) Partial Upper,    Pulmonary neg pulmonary ROS,          Cardiovascular hypertension, Pt. on medications  EKG 12/2013 LVH   Neuro/Psych negative neurological ROS     GI/Hepatic negative GI ROS, Neg liver ROS,   Endo/Other  diabetes, Poorly Controlled, Type 2  Renal/GU Renal InsufficiencyRenal diseaseGFR 32  negative genitourinary   Musculoskeletal   Abdominal (+)  Abdomen: soft.    Peds  Hematology   Anesthesia Other Findings   Reproductive/Obstetrics                         Anesthesia Physical Anesthesia Plan  ASA: II  Anesthesia Plan: General   Post-op Pain Management:    Induction: Intravenous  Airway Management Planned: Oral ETT  Additional Equipment:   Intra-op Plan:   Post-operative Plan: Extubation in OR  Informed Consent: I have reviewed the patients History and Physical, chart, labs and discussed the procedure including the risks, benefits and alternatives for the proposed anesthesia with the patient or authorized representative who has indicated his/her understanding and acceptance.     Plan Discussed with:   Anesthesia Plan Comments: (Multinodal pain RX, check AM ISTAT, 2nd IV after turning)      Anesthesia Quick Evaluation

## 2014-06-30 ENCOUNTER — Encounter (HOSPITAL_COMMUNITY)
Admission: RE | Disposition: A | Discharge status: Home Health | Payer: Medicare Other | Source: Ambulatory Visit | Attending: Neurosurgery

## 2014-06-30 ENCOUNTER — Inpatient Hospital Stay (HOSPITAL_COMMUNITY)
Admission: RE | Admit: 2014-06-30 | Discharge: 2014-07-03 | DRG: 460 | Disposition: A | Discharge status: Home Health | Payer: Medicare Other | Source: Ambulatory Visit | Attending: Neurosurgery | Admitting: Neurosurgery

## 2014-06-30 ENCOUNTER — Encounter (HOSPITAL_COMMUNITY): Payer: Self-pay | Admitting: Surgery

## 2014-06-30 ENCOUNTER — Inpatient Hospital Stay (HOSPITAL_COMMUNITY): Payer: Medicare Other

## 2014-06-30 ENCOUNTER — Inpatient Hospital Stay (HOSPITAL_COMMUNITY): Payer: Medicare Other | Admitting: Vascular Surgery

## 2014-06-30 ENCOUNTER — Inpatient Hospital Stay (HOSPITAL_COMMUNITY): Payer: Medicare Other | Admitting: Anesthesiology

## 2014-06-30 DIAGNOSIS — Z419 Encounter for procedure for purposes other than remedying health state, unspecified: Secondary | ICD-10-CM

## 2014-06-30 DIAGNOSIS — Z8546 Personal history of malignant neoplasm of prostate: Secondary | ICD-10-CM

## 2014-06-30 DIAGNOSIS — E119 Type 2 diabetes mellitus without complications: Secondary | ICD-10-CM | POA: Diagnosis present

## 2014-06-30 DIAGNOSIS — M4316 Spondylolisthesis, lumbar region: Secondary | ICD-10-CM | POA: Diagnosis present

## 2014-06-30 DIAGNOSIS — M4802 Spinal stenosis, cervical region: Secondary | ICD-10-CM | POA: Diagnosis present

## 2014-06-30 DIAGNOSIS — M4806 Spinal stenosis, lumbar region: Secondary | ICD-10-CM | POA: Diagnosis present

## 2014-06-30 DIAGNOSIS — N189 Chronic kidney disease, unspecified: Secondary | ICD-10-CM | POA: Diagnosis present

## 2014-06-30 DIAGNOSIS — I129 Hypertensive chronic kidney disease with stage 1 through stage 4 chronic kidney disease, or unspecified chronic kidney disease: Secondary | ICD-10-CM | POA: Diagnosis present

## 2014-06-30 DIAGNOSIS — E780 Pure hypercholesterolemia: Secondary | ICD-10-CM | POA: Diagnosis present

## 2014-06-30 DIAGNOSIS — M5416 Radiculopathy, lumbar region: Secondary | ICD-10-CM | POA: Diagnosis present

## 2014-06-30 DIAGNOSIS — R339 Retention of urine, unspecified: Secondary | ICD-10-CM | POA: Diagnosis not present

## 2014-06-30 DIAGNOSIS — M5106 Intervertebral disc disorders with myelopathy, lumbar region: Secondary | ICD-10-CM | POA: Diagnosis present

## 2014-06-30 LAB — POCT I-STAT, CHEM 8
BUN: 38 mg/dL — ABNORMAL HIGH (ref 6–23)
CREATININE: 2 mg/dL — AB (ref 0.50–1.35)
Calcium, Ion: 1.32 mmol/L — ABNORMAL HIGH (ref 1.13–1.30)
Chloride: 102 mEq/L (ref 96–112)
GLUCOSE: 109 mg/dL — AB (ref 70–99)
HCT: 43 % (ref 39.0–52.0)
Hemoglobin: 14.6 g/dL (ref 13.0–17.0)
POTASSIUM: 3.2 mmol/L — AB (ref 3.5–5.1)
Sodium: 140 mmol/L (ref 135–145)
TCO2: 23 mmol/L (ref 0–100)

## 2014-06-30 LAB — GLUCOSE, CAPILLARY
GLUCOSE-CAPILLARY: 162 mg/dL — AB (ref 70–99)
Glucose-Capillary: 96 mg/dL (ref 70–99)
Glucose-Capillary: 155 mg/dL — ABNORMAL HIGH (ref 70–99)
Glucose-Capillary: 175 mg/dL — ABNORMAL HIGH (ref 70–99)

## 2014-06-30 LAB — POCT I-STAT 4, (NA,K, GLUC, HGB,HCT)
GLUCOSE: 109 mg/dL — AB (ref 70–99)
HCT: 44 % (ref 39.0–52.0)
Hemoglobin: 15 g/dL (ref 13.0–17.0)
POTASSIUM: 3.3 mmol/L — AB (ref 3.5–5.1)
SODIUM: 141 mmol/L (ref 135–145)

## 2014-06-30 SURGERY — POSTERIOR LUMBAR FUSION 2 LEVEL
Anesthesia: General | Site: Spine Lumbar

## 2014-06-30 MED ORDER — ROCURONIUM BROMIDE 50 MG/5ML IV SOLN
INTRAVENOUS | Status: AC
  Filled 2014-06-30: qty 1

## 2014-06-30 MED ORDER — ATENOLOL-CHLORTHALIDONE 50-25 MG PO TABS: 0.5000 | ORAL_TABLET | Freq: Every day | ORAL | Status: DC

## 2014-06-30 MED ORDER — LINAGLIPTIN 5 MG PO TABS
5.0000 mg | ORAL_TABLET | Freq: Every day | ORAL | Status: DC
  Administered 2014-07-01 – 2014-07-03 (×3): 5 mg via ORAL
  Filled 2014-06-30 (×3): qty 1

## 2014-06-30 MED ORDER — ATORVASTATIN CALCIUM 10 MG PO TABS
20.0000 mg | ORAL_TABLET | Freq: Every day | ORAL | Status: DC
  Administered 2014-06-30 – 2014-07-02 (×3): 20 mg via ORAL
  Filled 2014-06-30: qty 1
  Filled 2014-06-30: qty 2
  Filled 2014-06-30 (×2): qty 1

## 2014-06-30 MED ORDER — NEOSTIGMINE METHYLSULFATE 10 MG/10ML IV SOLN
INTRAVENOUS | Status: DC | PRN
Start: 2014-06-30 — End: 2014-06-30
  Administered 2014-06-30: 4 mg via INTRAVENOUS

## 2014-06-30 MED ORDER — HYDROCODONE-ACETAMINOPHEN 10-325 MG PO TABS
1.0000 | ORAL_TABLET | ORAL | Status: DC | PRN
Start: 2014-06-30 — End: 2014-06-30

## 2014-06-30 MED ORDER — PHENOL 1.4 % MT LIQD: 1.0000 | OROMUCOSAL | Status: DC | PRN

## 2014-06-30 MED ORDER — MORPHINE SULFATE 2 MG/ML IJ SOLN
1.0000 mg | INTRAMUSCULAR | Status: DC | PRN
  Administered 2014-06-30 – 2014-07-02 (×2): 2 mg via INTRAVENOUS
  Filled 2014-06-30 (×2): qty 1

## 2014-06-30 MED ORDER — LIDOCAINE HCL (CARDIAC) 20 MG/ML IV SOLN
INTRAVENOUS | Status: DC | PRN
  Administered 2014-06-30: 100 mg via INTRAVENOUS

## 2014-06-30 MED ORDER — OXYCODONE-ACETAMINOPHEN 5-325 MG PO TABS
1.0000 | ORAL_TABLET | ORAL | Status: DC | PRN
  Administered 2014-06-30 – 2014-07-02 (×7): 2 via ORAL
  Filled 2014-06-30 (×7): qty 2

## 2014-06-30 MED ORDER — BACITRACIN 50000 UNITS IM SOLR
INTRAMUSCULAR | Status: DC | PRN
  Administered 2014-06-30: 07:00:00

## 2014-06-30 MED ORDER — BUPIVACAINE LIPOSOME 1.3 % IJ SUSP
20.0000 mL | INTRAMUSCULAR | Status: AC
  Administered 2014-06-30: 20 mL
  Filled 2014-06-30: qty 20

## 2014-06-30 MED ORDER — ALLOPURINOL 100 MG PO TABS
300.0000 mg | ORAL_TABLET | Freq: Every day | ORAL | Status: DC
  Administered 2014-06-30 – 2014-07-03 (×4): 300 mg via ORAL
  Filled 2014-06-30 (×3): qty 1
  Filled 2014-06-30: qty 3

## 2014-06-30 MED ORDER — ACETAMINOPHEN 325 MG PO TABS: 650.0000 mg | ORAL_TABLET | ORAL | Status: DC | PRN

## 2014-06-30 MED ORDER — PROPOFOL 10 MG/ML IV BOLUS
INTRAVENOUS | Status: DC | PRN
  Administered 2014-06-30: 100 mg via INTRAVENOUS

## 2014-06-30 MED ORDER — THROMBIN 20000 UNITS EX SOLR
CUTANEOUS | Status: DC | PRN
  Administered 2014-06-30: 07:00:00 via TOPICAL

## 2014-06-30 MED ORDER — FENTANYL CITRATE 0.05 MG/ML IJ SOLN
INTRAMUSCULAR | Status: DC | PRN
  Administered 2014-06-30: 50 ug via INTRAVENOUS
  Administered 2014-06-30: 100 ug via INTRAVENOUS
  Administered 2014-06-30 (×3): 50 ug via INTRAVENOUS

## 2014-06-30 MED ORDER — ROCURONIUM BROMIDE 100 MG/10ML IV SOLN
INTRAVENOUS | Status: DC | PRN
  Administered 2014-06-30: 10 mg via INTRAVENOUS
  Administered 2014-06-30: 40 mg via INTRAVENOUS

## 2014-06-30 MED ORDER — DOCUSATE SODIUM 100 MG PO CAPS
100.0000 mg | ORAL_CAPSULE | Freq: Two times a day (BID) | ORAL | Status: DC
  Administered 2014-06-30 – 2014-07-03 (×6): 100 mg via ORAL
  Filled 2014-06-30 (×6): qty 1

## 2014-06-30 MED ORDER — ROPINIROLE HCL 1 MG PO TABS
0.5000 mg | ORAL_TABLET | Freq: Every day | ORAL | Status: DC
  Administered 2014-06-30 – 2014-07-02 (×3): 0.5 mg via ORAL
  Filled 2014-06-30 (×3): qty 1

## 2014-06-30 MED ORDER — ONDANSETRON HCL 4 MG/2ML IJ SOLN
INTRAMUSCULAR | Status: AC
  Filled 2014-06-30: qty 2

## 2014-06-30 MED ORDER — SODIUM CHLORIDE 0.9 % IV SOLN
INTRAVENOUS | Status: DC | PRN
  Administered 2014-06-30: 12:00:00 via INTRAVENOUS

## 2014-06-30 MED ORDER — FENTANYL CITRATE 0.05 MG/ML IJ SOLN
INTRAMUSCULAR | Status: AC
  Filled 2014-06-30: qty 5

## 2014-06-30 MED ORDER — PROMETHAZINE HCL 25 MG/ML IJ SOLN: 6.2500 mg | INTRAMUSCULAR | Status: DC | PRN

## 2014-06-30 MED ORDER — BUPIVACAINE-EPINEPHRINE (PF) 0.5% -1:200000 IJ SOLN
INTRAMUSCULAR | Status: DC | PRN
  Administered 2014-06-30: 10 mL via PERINEURAL

## 2014-06-30 MED ORDER — PROPOFOL 10 MG/ML IV BOLUS
INTRAVENOUS | Status: AC
  Filled 2014-06-30: qty 20

## 2014-06-30 MED ORDER — 0.9 % SODIUM CHLORIDE (POUR BTL) OPTIME
TOPICAL | Status: DC | PRN
  Administered 2014-06-30: 1000 mL

## 2014-06-30 MED ORDER — FENTANYL CITRATE 0.05 MG/ML IJ SOLN
INTRAMUSCULAR | Status: AC
  Filled 2014-06-30: qty 2

## 2014-06-30 MED ORDER — LACTATED RINGERS IV SOLN
INTRAVENOUS | Status: DC | PRN
  Administered 2014-06-30 (×2): via INTRAVENOUS

## 2014-06-30 MED ORDER — ARTIFICIAL TEARS OP OINT
TOPICAL_OINTMENT | OPHTHALMIC | Status: AC
  Filled 2014-06-30: qty 3.5

## 2014-06-30 MED ORDER — ALUM & MAG HYDROXIDE-SIMETH 200-200-20 MG/5ML PO SUSP: 30.0000 mL | Freq: Four times a day (QID) | ORAL | Status: DC | PRN

## 2014-06-30 MED ORDER — CEFAZOLIN SODIUM-DEXTROSE 2-3 GM-% IV SOLR
2.0000 g | Freq: Three times a day (TID) | INTRAVENOUS | Status: AC
  Administered 2014-06-30 – 2014-07-01 (×2): 2 g via INTRAVENOUS
  Filled 2014-06-30 (×2): qty 50

## 2014-06-30 MED ORDER — ONDANSETRON HCL 4 MG/2ML IJ SOLN
INTRAMUSCULAR | Status: DC | PRN
  Administered 2014-06-30: 4 mg via INTRAVENOUS

## 2014-06-30 MED ORDER — HYDROCODONE-ACETAMINOPHEN 5-325 MG PO TABS
1.0000 | ORAL_TABLET | ORAL | Status: DC | PRN
  Administered 2014-07-03 (×2): 2 via ORAL
  Filled 2014-06-30 (×2): qty 2

## 2014-06-30 MED ORDER — FENTANYL CITRATE 0.05 MG/ML IJ SOLN
25.0000 ug | INTRAMUSCULAR | Status: DC | PRN
  Administered 2014-06-30 (×3): 50 ug via INTRAVENOUS

## 2014-06-30 MED ORDER — ONDANSETRON HCL 4 MG/2ML IJ SOLN: 4.0000 mg | INTRAMUSCULAR | Status: DC | PRN

## 2014-06-30 MED ORDER — FENOFIBRATE 160 MG PO TABS
160.0000 mg | ORAL_TABLET | Freq: Every day | ORAL | Status: DC
  Administered 2014-06-30 – 2014-07-03 (×4): 160 mg via ORAL
  Filled 2014-06-30 (×4): qty 1

## 2014-06-30 MED ORDER — ATENOLOL 25 MG PO TABS
25.0000 mg | ORAL_TABLET | Freq: Every day | ORAL | Status: DC
  Administered 2014-07-01 – 2014-07-03 (×3): 25 mg via ORAL
  Filled 2014-06-30 (×3): qty 1

## 2014-06-30 MED ORDER — NEOSTIGMINE METHYLSULFATE 10 MG/10ML IV SOLN
INTRAVENOUS | Status: AC
  Filled 2014-06-30: qty 1

## 2014-06-30 MED ORDER — LACTATED RINGERS IV SOLN: INTRAVENOUS | Status: DC

## 2014-06-30 MED ORDER — GLYCOPYRROLATE 0.2 MG/ML IJ SOLN
INTRAMUSCULAR | Status: DC | PRN
  Administered 2014-06-30: 0.6 mg via INTRAVENOUS

## 2014-06-30 MED ORDER — DIAZEPAM 5 MG PO TABS
5.0000 mg | ORAL_TABLET | Freq: Four times a day (QID) | ORAL | Status: DC | PRN
  Administered 2014-06-30 – 2014-07-02 (×2): 5 mg via ORAL
  Filled 2014-06-30 (×2): qty 1

## 2014-06-30 MED ORDER — ARTIFICIAL TEARS OP OINT
TOPICAL_OINTMENT | OPHTHALMIC | Status: DC | PRN
  Administered 2014-06-30: 1 via OPHTHALMIC

## 2014-06-30 MED ORDER — CHLORTHALIDONE 25 MG PO TABS
12.5000 mg | ORAL_TABLET | Freq: Every day | ORAL | Status: DC
  Administered 2014-07-01 – 2014-07-02 (×2): 12.5 mg via ORAL
  Filled 2014-06-30 (×3): qty 0.5

## 2014-06-30 MED ORDER — MENTHOL 3 MG MT LOZG: 1.0000 | LOZENGE | OROMUCOSAL | Status: DC | PRN

## 2014-06-30 MED ORDER — LIDOCAINE HCL (CARDIAC) 20 MG/ML IV SOLN
INTRAVENOUS | Status: AC
  Filled 2014-06-30: qty 5

## 2014-06-30 MED ORDER — MEPERIDINE HCL 25 MG/ML IJ SOLN: 6.2500 mg | INTRAMUSCULAR | Status: DC | PRN

## 2014-06-30 MED ORDER — PHENYLEPHRINE HCL 10 MG/ML IJ SOLN
INTRAMUSCULAR | Status: DC | PRN
  Administered 2014-06-30: 120 ug via INTRAVENOUS

## 2014-06-30 MED ORDER — EPHEDRINE SULFATE 50 MG/ML IJ SOLN
INTRAMUSCULAR | Status: DC | PRN
  Administered 2014-06-30: 15 mg via INTRAVENOUS
  Administered 2014-06-30: 10 mg via INTRAVENOUS

## 2014-06-30 MED ORDER — ALBUMIN HUMAN 5 % IV SOLN
INTRAVENOUS | Status: DC | PRN
  Administered 2014-06-30: 11:00:00 via INTRAVENOUS

## 2014-06-30 MED ORDER — GLYCOPYRROLATE 0.2 MG/ML IJ SOLN
INTRAMUSCULAR | Status: AC
  Filled 2014-06-30: qty 3

## 2014-06-30 MED ORDER — VECURONIUM BROMIDE 10 MG IV SOLR
INTRAVENOUS | Status: DC | PRN
  Administered 2014-06-30: 2 mg via INTRAVENOUS

## 2014-06-30 MED ORDER — ACETAMINOPHEN 650 MG RE SUPP: 650.0000 mg | RECTAL | Status: DC | PRN

## 2014-06-30 SURGICAL SUPPLY — 75 items
BAG DECANTER FOR FLEXI CONT (MISCELLANEOUS) ×3 IMPLANT
BENZOIN TINCTURE PRP APPL 2/3 (GAUZE/BANDAGES/DRESSINGS) ×3 IMPLANT
BLADE CLIPPER SURG (BLADE) ×3 IMPLANT
BRUSH SCRUB EZ PLAIN DRY (MISCELLANEOUS) ×3 IMPLANT
BUR MATCHSTICK NEURO 3.0 LAGG (BURR) ×3 IMPLANT
BUR PRECISION FLUTE 6.0 (BURR) ×3 IMPLANT
CANISTER SUCT 3000ML (MISCELLANEOUS) ×3 IMPLANT
CAP REVERE LOCKING (Cap) ×18 IMPLANT
CLOSURE WOUND 1/2 X4 (GAUZE/BANDAGES/DRESSINGS) ×1
CONN CROSSLINK REV 6.35 48-60 (Connector) ×3 IMPLANT
CONNECTOR CRSLNK REV6.35 48-60 (Connector) ×1 IMPLANT
CONT SPEC 4OZ CLIKSEAL STRL BL (MISCELLANEOUS) ×6 IMPLANT
COVER BACK TABLE 60X90IN (DRAPES) ×3 IMPLANT
DRAPE C-ARM 42X72 X-RAY (DRAPES) ×6 IMPLANT
DRAPE LAPAROTOMY 100X72X124 (DRAPES) ×3 IMPLANT
DRAPE POUCH INSTRU U-SHP 10X18 (DRAPES) ×3 IMPLANT
DRAPE PROXIMA HALF (DRAPES) ×3 IMPLANT
DRAPE SURG 17X23 STRL (DRAPES) ×12 IMPLANT
ELECT BLADE 4.0 EZ CLEAN MEGAD (MISCELLANEOUS) ×6
ELECT REM PT RETURN 9FT ADLT (ELECTROSURGICAL) ×3
ELECTRODE BLDE 4.0 EZ CLN MEGD (MISCELLANEOUS) ×2 IMPLANT
ELECTRODE REM PT RTRN 9FT ADLT (ELECTROSURGICAL) ×1 IMPLANT
EVACUATOR 1/8 PVC DRAIN (DRAIN) ×3 IMPLANT
GAUZE SPONGE 4X4 12PLY STRL (GAUZE/BANDAGES/DRESSINGS) ×3 IMPLANT
GAUZE SPONGE 4X4 16PLY XRAY LF (GAUZE/BANDAGES/DRESSINGS) ×3 IMPLANT
GLOVE BIO SURGEON STRL SZ8.5 (GLOVE) ×6 IMPLANT
GLOVE BIOGEL PI IND STRL 7.0 (GLOVE) ×7 IMPLANT
GLOVE BIOGEL PI IND STRL 8 (GLOVE) ×1 IMPLANT
GLOVE BIOGEL PI INDICATOR 7.0 (GLOVE) ×14
GLOVE BIOGEL PI INDICATOR 8 (GLOVE) ×2
GLOVE ECLIPSE 7.0 STRL STRAW (GLOVE) ×3 IMPLANT
GLOVE ECLIPSE 7.5 STRL STRAW (GLOVE) ×9 IMPLANT
GLOVE EXAM NITRILE LRG STRL (GLOVE) IMPLANT
GLOVE EXAM NITRILE MD LF STRL (GLOVE) IMPLANT
GLOVE EXAM NITRILE XL STR (GLOVE) IMPLANT
GLOVE EXAM NITRILE XS STR PU (GLOVE) IMPLANT
GLOVE INDICATOR 7.5 STRL GRN (GLOVE) ×9 IMPLANT
GLOVE SS BIOGEL STRL SZ 8 (GLOVE) ×2 IMPLANT
GLOVE SUPERSENSE BIOGEL SZ 8 (GLOVE) ×4
GOWN STRL REUS W/ TWL LRG LVL3 (GOWN DISPOSABLE) ×1 IMPLANT
GOWN STRL REUS W/ TWL XL LVL3 (GOWN DISPOSABLE) ×5 IMPLANT
GOWN STRL REUS W/TWL 2XL LVL3 (GOWN DISPOSABLE) IMPLANT
GOWN STRL REUS W/TWL LRG LVL3 (GOWN DISPOSABLE) ×2
GOWN STRL REUS W/TWL XL LVL3 (GOWN DISPOSABLE) ×10
KIT BASIN OR (CUSTOM PROCEDURE TRAY) ×3 IMPLANT
KIT ROOM TURNOVER OR (KITS) ×3 IMPLANT
NEEDLE HYPO 21X1.5 SAFETY (NEEDLE) ×3 IMPLANT
NEEDLE HYPO 22GX1.5 SAFETY (NEEDLE) ×3 IMPLANT
NS IRRIG 1000ML POUR BTL (IV SOLUTION) ×3 IMPLANT
PACK LAMINECTOMY NEURO (CUSTOM PROCEDURE TRAY) ×3 IMPLANT
PAD ARMBOARD 7.5X6 YLW CONV (MISCELLANEOUS) ×9 IMPLANT
PATTIES SURGICAL .5 X1 (DISPOSABLE) IMPLANT
PATTIES SURGICAL 1X1 (DISPOSABLE) ×3 IMPLANT
PENCIL BUTTON HOLSTER BLD 10FT (ELECTRODE) ×3 IMPLANT
ROD REVERE 6.35 CURVED 85MM (Rod) ×6 IMPLANT
SCREW REVERE 6.5X50MM (Screw) ×18 IMPLANT
SPACER ALTERA 10X31 8-12MM-8 (Spacer) ×3 IMPLANT
SPACER ALTERA 10X31 9-13MM-8 (Spacer) ×3 IMPLANT
SPONGE LAP 4X18 X RAY DECT (DISPOSABLE) IMPLANT
SPONGE NEURO XRAY DETECT 1X3 (DISPOSABLE) IMPLANT
SPONGE SURGIFOAM ABS GEL 100 (HEMOSTASIS) ×3 IMPLANT
STRIP BIOACTIVE 10CC 25X100X4 (Miscellaneous) ×3 IMPLANT
STRIP BIOACTIVE 10CC 25X50X8 (Miscellaneous) ×3 IMPLANT
STRIP CLOSURE SKIN 1/2X4 (GAUZE/BANDAGES/DRESSINGS) ×2 IMPLANT
SUT VIC AB 1 CT1 18XBRD ANBCTR (SUTURE) ×2 IMPLANT
SUT VIC AB 1 CT1 8-18 (SUTURE) ×4
SUT VIC AB 2-0 CP2 18 (SUTURE) ×9 IMPLANT
SYR 20CC LL (SYRINGE) ×3 IMPLANT
SYR 20ML ECCENTRIC (SYRINGE) ×3 IMPLANT
TAPE CLOTH SURG 4X10 WHT LF (GAUZE/BANDAGES/DRESSINGS) ×3 IMPLANT
TOWEL OR 17X24 6PK STRL BLUE (TOWEL DISPOSABLE) ×3 IMPLANT
TOWEL OR 17X26 10 PK STRL BLUE (TOWEL DISPOSABLE) ×3 IMPLANT
TRAY FOLEY CATH 14FRSI W/METER (CATHETERS) IMPLANT
TRAY FOLEY METER SIL LF 16FR (CATHETERS) ×3 IMPLANT
WATER STERILE IRR 1000ML POUR (IV SOLUTION) ×3 IMPLANT

## 2014-06-30 NOTE — Transfer of Care (Signed)
Immediate Anesthesia Transfer of Care Note  Patient: Ricky Holden  Procedure(s) Performed: Procedure(s): POSTERIOR LUMBAR INTERBODY FUSION LUMBAR THREE-FOUR LUMBAR FOUR-FIVE ,POSTERIOR LATERAL ARTHRODESIS AND POSTERIOR SEGMENTAL INSTRUMENTATION WITH LAMINECTOMIES (N/A)  Patient Location: PACU  Anesthesia Type:General  Level of Consciousness: awake, oriented, patient cooperative and lethargic  Airway & Oxygen Therapy: Patient Spontanous Breathing and Patient connected to nasal cannula oxygen  Post-op Assessment: Report given to PACU RN, Post -op Vital signs reviewed and stable and Patient moving all extremities X 4  Post vital signs: Reviewed and stable  Complications: No apparent anesthesia complications

## 2014-06-30 NOTE — Anesthesia Procedure Notes (Signed)
Procedure Name: Intubation Date/Time: 06/30/2014 7:36 AM Performed by: Ned Grace Pre-anesthesia Checklist: Patient identified, Timeout performed, Emergency Drugs available, Suction available and Patient being monitored Patient Re-evaluated:Patient Re-evaluated prior to inductionOxygen Delivery Method: Circle system utilized Preoxygenation: Pre-oxygenation with 100% oxygen Intubation Type: IV induction Ventilation: Mask ventilation without difficulty and Oral airway inserted - appropriate to patient size Laryngoscope size: Glidescope- adult large. Grade View: Grade I Tube type: Oral Tube size: 7.5 mm Number of attempts: 1 Airway Equipment and Method: Video-laryngoscopy and Rigid stylet Placement Confirmation: ETT inserted through vocal cords under direct vision,  breath sounds checked- equal and bilateral and positive ETCO2 Secured at: 22 cm Tube secured with: Tape Dental Injury: Teeth and Oropharynx as per pre-operative assessment  Comments: Glidescope used electively d/t ongoing neck/arm pain.  Gr I view, ETT advanced without difficulty while head/neck maintained in neutral position.

## 2014-06-30 NOTE — Op Note (Signed)
Brief history: The patient is a 81 year old white male who's had a history of a cervical myelopathy. He has had back pain and leg weakness. He has failed medical management and was worked up with a lumbar MRI and lumbar x-rays which demonstrated a spondylolisthesis and spinal stenosis. I discussed the situation with the patient and his wife. He has decided to proceed with surgery after weighing the risks, benefits and alternatives.  Preoperative diagnosis: L3-4 and L4-5 spondylolisthesis, Degenerative disc disease, spinal stenosis compressing L3, L4 and L5 nerve roots; lumbago; lumbar radiculopathy  Postoperative diagnosis: Same  Procedure: L4 laminectomy and bilateral L3 Laminotomy/foraminotomies to decompress the bilateral L3, L4 and L5 nerve roots(the work required to do this was in addition to the work required to do the posterior lumbar interbody fusion because of the patient's spinal stenosis, facet arthropathy. Etc. requiring a wide decompression of the nerve roots.); L3-4 and L4-5 transforaminal lumbar interbody fusion with local morselized autograft bone and Kinnex graft extender; insertion of interbody prosthesis at L3-4 and L4-5 (globus peek expandable interbody prosthesis); posterior segmental instrumentation from L3 to L5 with globus titanium pedicle screws and rods; posterior lateral arthrodesis at L3-4 and L4-5 with local morselized autograft bone and Kinnex bone graft extender.  Surgeon: Dr. Earle Gell  Asst.: Dr. Jovita Gamma  Anesthesia: Gen. endotracheal  Estimated blood loss: 350 mL  Drains: One medium Hemovac  Complications: None  Description of procedure: The patient was brought to the operating room by the anesthesia team. General endotracheal anesthesia was induced. The patient was turned to the prone position on the Wilson frame. The patient's lumbosacral region was then prepared with Betadine scrub and Betadine solution. Sterile drapes were applied.  I then  injected the area to be incised with Marcaine with epinephrine solution. I then used the scalpel to make a linear midline incision over the L3-4 and L4-5 interspace. I then used electrocautery to perform a bilateral subperiosteal dissection exposing the spinous process and lamina of L2-L5. We then obtained intraoperative radiograph to confirm our location. We then inserted the Verstrac retractor to provide exposure. I incised the interspinous ligament at L3-4 and L4-5. I used a Leksell rongeur to remove the L4 spinous process.  I began the decompression by using the high speed drill to perform laminotomies at L3 and L4 bilaterally. We then used the Kerrison punches to complete the L4 laminectomy and to widen the lateral laminotomy at L3 and removed the ligamentum flavum at L3-4 and L4-5. We used the Kerrison punches to remove the medial facets at L3-4 and L4-5. We performed wide foraminotomies about the bilateral L3, L4 and L5 nerve roots completing the decompression.  We now turned our attention to the posterior lumbar interbody fusion. I used a scalpel to incise the intervertebral disc at L3-4 and L4-5. I then performed a partial intervertebral discectomy at L3-4 and L4-5 using the pituitary forceps. We prepared the vertebral endplates at all 34 and L4-5 for the fusion by removing the soft tissues with the curettes. We then used the trial spacers to pick the appropriate sized interbody prosthesis. We prefilled his prosthesis with a combination of local morselized autograft bone that we obtained during the decompression as well as Kinnex bone graft extender. We inserted the prefilled prosthesis into the interspace at L3-4 and L4-5 from the right. We expanded the prosthesis at both levels. There was a good snug fit of the prosthesis in the interspace. We then filled and the remainder of the intervertebral disc space with  local morselized autograft bone and Kinnex. This completed the posterior lumbar interbody  arthrodesis.  We now turned attention to the instrumentation. Under fluoroscopic guidance we cannulated the bilateral L3, L4 and L5 pedicles with the bone probe. We then removed the bone probe. We then tapped the pedicle with a 5.5 millimeter tap. We then removed the tap. We probed inside the tapped pedicle with a ball probe to rule out cortical breaches. We then inserted a 6.5 x 50 millimeter pedicle screw into the L3, L4 and L5 pedicles bilaterally under fluoroscopic guidance. We then palpated along the medial aspect of the pedicles to rule out cortical breaches. There were none. The nerve roots were not injured. We then connected the unilateral pedicle screws with a lordotic rod. We compressed the construct and secured the rod in place with the caps. We placed a cross connector between the rods. We then tightened the caps appropriately. This completed the instrumentation from L3-L5.  We now turned our attention to the posterior lateral arthrodesis at L3-4 and L4-5. We used the high-speed drill to decorticate the remainder of the facets, pars, transverse process at L3-4 and L4-5. We then applied a combination of local morselized autograft bone and Kinnex bone graft extender over these decorticated posterior lateral structures. This completed the posterior lateral arthrodesis.  We then obtained hemostasis using bipolar electrocautery. We irrigated the wound out with bacitracin solution. We inspected the thecal sac and nerve roots and noted they were well decompressed. We then removed the retractor. We placed a medium Hemovac drain in the epidural space and tunneled out through separate stab wound. We reapproximated patient's thoracolumbar fascia with interrupted #1 Vicryl suture. We reapproximated patient's subcutaneous tissue with interrupted 2-0 Vicryl suture. The reapproximated patient's skin with Steri-Strips and benzoin. The wound was then coated with bacitracin ointment. A sterile dressing was applied.  The drapes were removed. The patient was subsequently returned to the supine position where they were extubated by the anesthesia team. He was then transported to the post anesthesia care unit in stable condition. All sponge instrument and needle counts were reportedly correct at the end of this case.

## 2014-06-30 NOTE — H&P (Signed)
Subjective: The patient is a 81 year old Asian male who underwent a three-level anterior cervical discectomy, fusion, and plating in August 2015 for a cervical myelopathy. His also had chronic back pain and a known lumbar spondylolisthesis. He has failed medical management. We discussed the various treatment options including surgery. The patient has weighed the risks, benefits, and alternative surgery and decided proceed with an L3-4 and L4-5 decompression, instrumentation, and fusion.   Past Medical History  Diagnosis Date  . Cancer     Prostate cancer - radiation  . Hypertension   . High cholesterol   . Arthritis   . Bell's palsy   . Cataract     has had surgery  . Diabetes mellitus without complication     type 2  . Chronic kidney disease     Cr 1.40 - 2.0 from ~ 2010 - 2015    Past Surgical History  Procedure Laterality Date  . Eye surgery Left     cataract surgery with lens implant  . Tonsillectomy    . Tympanoplasty Left     wears hearing aids  . Colonoscopy    . Anterior cervical decomp/discectomy fusion N/A 01/24/2014    Procedure: ANTERIOR CERVICAL DECOMPRESSION/DISCECTOMY FUSION 3 LEVELS Cervical three/four,four/five, five/six  anterior cervical decompression with fusion interbody prosthesis plating and bonegraft;  Surgeon: Newman Pies, MD;  Location: [facility];  Service: Neurosurgery;  Laterality: N/A;    No Known Allergies  History  Substance Use Topics  . Smoking status: Never Smoker   . Smokeless tobacco: Never Used  . Alcohol Use: No    Family History  Problem Relation Age of Onset  . Colon cancer Mother    Prior to Admission medications   Medication Sig Start Date End Date Taking? Authorizing Provider  allopurinol (ZYLOPRIM) 300 MG tablet Take 300 mg by mouth daily.   Yes Historical Provider, MD  atenolol-chlorthalidone (TENORETIC) 50-25 MG per tablet Take 0.5 tablets by mouth daily.   Yes Historical Provider, MD  atorvastatin (LIPITOR) 40 MG tablet  Take 20 mg by mouth daily.   Yes Historical Provider, MD  celecoxib (CELEBREX) 200 MG capsule Take 200 mg by mouth daily.   Yes Historical Provider, MD  fenofibrate 160 MG tablet Take 160 mg by mouth daily.   Yes Historical Provider, MD  HYDROcodone-acetaminophen (NORCO) 10-325 MG per tablet Take 0.5-1 tablets by mouth every 6 (six) hours as needed (pain).   Yes Historical Provider, MD  linagliptin (TRADJENTA) 5 MG TABS tablet Take 5 mg by mouth daily.   Yes Historical Provider, MD  rOPINIRole (REQUIP) 0.5 MG tablet Take 0.5 mg by mouth at bedtime.   Yes Historical Provider, MD  docusate sodium 100 MG CAPS Take 100 mg by mouth 2 (two) times daily. Patient not taking: Reported on 06/22/2014 01/26/14   Newman Pies, MD  oxyCODONE-acetaminophen (PERCOCET/ROXICET) 5-325 MG per tablet Take 1-2 tablets by mouth every 4 (four) hours as needed for moderate pain. Patient not taking: Reported on 06/22/2014 01/26/14   Newman Pies, MD     Review of Systems  Positive ROS: As above  All other systems have been reviewed and were otherwise negative with the exception of those mentioned in the HPI and as above.  Objective: Vital signs in last 24 hours: Temp:  [98.1 F (36.7 C)] 98.1 F (36.7 C) (01/07 0607) Pulse Rate:  [64] 64 (01/07 0607) Resp:  [20] 20 (01/07 0607) BP: (102)/(67) 102/67 mmHg (01/07 0607) SpO2:  [97 %] 97 % (01/07  0607)  General Appearance: Alert, cooperative, no distress, Head: Normocephalic, without obvious abnormality, atraumatic Eyes: PERRL, conjunctiva/corneas clear, EOM's intact,    Ears: Normal  Throat: Normal  Neck: Supple, symmetrical, trachea midline, no adenopathy; thyroid: No enlargement/tenderness/nodules; no carotid bruit or JVD. The patient cervical incision is well-healed. Back: Symmetric, no curvature, ROM normal, no CVA tenderness Lungs: Clear to auscultation bilaterally, respirations unlabored Heart: Regular rate and rhythm, no murmur, rub or  gallop Abdomen: Soft, non-tender,, no masses, no organomegaly Extremities: Extremities normal, atraumatic, no cyanosis or edema Pulses: 2+ and symmetric all extremities Skin: Skin color, texture, turgor normal, no rashes or lesions  NEUROLOGIC:   Mental status: alert and oriented, no aphasia, good attention span, Fund of knowledge/ memory ok Motor Exam - grossly normal Sensory Exam - grossly normal Reflexes: Hyperreflexic Coordination - grossly normal Gait - unsteady Balance - unsteady Cranial Nerves: I: smell Not tested  II: visual acuity  OS: Normal  OD: Normal   II: visual fields Full to confrontation  II: pupils Equal, round, reactive to light  III,VII: ptosis None  III,IV,VI: extraocular muscles  Full ROM  V: mastication Normal  V: facial light touch sensation  Normal  V,VII: corneal reflex  Present  VII: facial muscle function - upper  Normal  VII: facial muscle function - lower Normal  VIII: hearing Not tested  IX: soft palate elevation  Normal  IX,X: gag reflex Present  XI: trapezius strength  5/5  XI: sternocleidomastoid strength 5/5  XI: neck flexion strength  5/5  XII: tongue strength  Normal    Data Review Lab Results  Component Value Date   WBC 4.3 06/22/2014   HGB 14.6 06/30/2014   HCT 43.0 06/30/2014   MCV 96.4 06/22/2014   PLT 158 06/22/2014   Lab Results  Component Value Date   NA 140 06/30/2014   K 3.2* 06/30/2014   CL 102 06/30/2014   CO2 23 06/22/2014   BUN 38* 06/30/2014   CREATININE 2.00* 06/30/2014   GLUCOSE 109* 06/30/2014   Lab Results  Component Value Date   INR 0.97 12/22/2009    Assessment/Plan: L3-4 and L4-5 spondylolisthesis, spinal stenosis, lumbago, lumbar radiculopathy, neurogenic claudication: I have discussed the situation with the patient and his wife. I have reviewed his imaging studies with him and pointed out the abnormalities. We have discussed the various treatment options including surgery. I have described the  surgical treatment option of L3-4 and L4-5 decompression, instrumentation, and fusion. I have shown him surgical models. We have discussed the risks, benefits, alternatives, and likelihood of achieving our goals with surgery. I have answered all the patient's questions. He has decided to proceed with surgery.   Sonya Gunnoe D 06/30/2014 7:17 AM

## 2014-06-30 NOTE — Progress Notes (Signed)
Subjective:  The patient is somnolent but arousable. He is in no apparent distress.  Objective: Vital signs in last 24 hours: Temp:  [98.1 F (36.7 C)-99 F (37.2 C)] 99 F (37.2 C) (01/07 1330) Pulse Rate:  [64-97] 97 (01/07 1333) Resp:  [20-41] 41 (01/07 1333) BP: (102-146)/(67-79) 146/79 mmHg (01/07 1333) SpO2:  [97 %] 97 % (01/07 1333)  Intake/Output from previous day:   Intake/Output this shift: Total I/O In: 3150 [I.V.:2700; Blood:200; IV Piggyback:250] Out: 925 [Urine:325; Blood:600]  Physical exam the patient is somnolent but arousable. He is moving his lower extremities well.  Lab Results:  Recent Labs  06/30/14 0559 06/30/14 0640  HGB 15.0 14.6  HCT 44.0 43.0   BMET  Recent Labs  06/30/14 0559 06/30/14 0640  NA 141 140  K 3.3* 3.2*  CL  --  102  GLUCOSE 109* 109*  BUN  --  38*  CREATININE  --  2.00*    Studies/Results: Dg Lumbar Spine 2-3 Views  06/30/2014   CLINICAL DATA:  L3 through L5 PLIF.  EXAM: DG C-ARM 61-120 MIN; LUMBAR SPINE - 2-3 VIEW  COMPARISON:  06/30/2014 at 0827 hr.  FINDINGS: L3 through L5 posterior lumbar interbody fusion in progress with pedicle screws at these levels and L3-L4 and L4-L5 discectomy with interbody spacer. This is demonstrated on AP and lateral fluoroscopic spot views.  IMPRESSION: Intraoperative localization from L3 through L5.  L3 through L5 PLIF.   Electronically Signed   By: Dereck Ligas M.D.   On: 06/30/2014 12:47   Dg Lumbar Spine 1 View  06/30/2014   CLINICAL DATA:  Spinal stenosis.  Spondylolisthesis.  EXAM: LUMBAR SPINE - 1 VIEW  COMPARISON:  MRI dated 10/30/2013  FINDINGS: Instruments are at the L4-5 level.  IMPRESSION: Instruments at L4-5.   Electronically Signed   By: Rozetta Nunnery M.D.   On: 06/30/2014 12:49   Dg C-arm 61-120 Min  06/30/2014   CLINICAL DATA:  L3 through L5 PLIF.  EXAM: DG C-ARM 61-120 MIN; LUMBAR SPINE - 2-3 VIEW  COMPARISON:  06/30/2014 at 0827 hr.  FINDINGS: L3 through L5 posterior lumbar  interbody fusion in progress with pedicle screws at these levels and L3-L4 and L4-L5 discectomy with interbody spacer. This is demonstrated on AP and lateral fluoroscopic spot views.  IMPRESSION: Intraoperative localization from L3 through L5.  L3 through L5 PLIF.   Electronically Signed   By: Dereck Ligas M.D.   On: 06/30/2014 12:47    Assessment/Plan: The patient is doing well.  LOS: 0 days     Leighanna Kirn D 06/30/2014, 1:44 PM

## 2014-06-30 NOTE — Progress Notes (Signed)
Utilization review completed.  

## 2014-06-30 NOTE — Anesthesia Postprocedure Evaluation (Signed)
  Anesthesia Post-op Note  Patient: Ricky Holden  Procedure(s) Performed: Procedure(s): POSTERIOR LUMBAR INTERBODY FUSION LUMBAR THREE-FOUR LUMBAR FOUR-FIVE ,POSTERIOR LATERAL ARTHRODESIS AND POSTERIOR SEGMENTAL INSTRUMENTATION WITH LAMINECTOMIES (N/A)  Patient Location: PACU  Anesthesia Type:General  Level of Consciousness: awake and alert   Airway and Oxygen Therapy: Patient Spontanous Breathing and Patient connected to nasal cannula oxygen  Post-op Pain: mild  Post-op Assessment: Post-op Vital signs reviewed and Patient's Cardiovascular Status Stable  Post-op Vital Signs: Reviewed and stable  Last Vitals:  Filed Vitals:   06/30/14 1333  BP: 146/79  Pulse: 97  Temp:   Resp: 41    Complications: No apparent anesthesia complications

## 2014-07-01 LAB — CBC
HCT: 29.2 % — ABNORMAL LOW (ref 39.0–52.0)
Hemoglobin: 10 g/dL — ABNORMAL LOW (ref 13.0–17.0)
MCH: 32.4 pg (ref 26.0–34.0)
MCHC: 34.2 g/dL (ref 30.0–36.0)
MCV: 94.5 fL (ref 78.0–100.0)
Platelets: 136 10*3/uL — ABNORMAL LOW (ref 150–400)
RBC: 3.09 MIL/uL — AB (ref 4.22–5.81)
RDW: 14 % (ref 11.5–15.5)
WBC: 8.2 10*3/uL (ref 4.0–10.5)

## 2014-07-01 LAB — BASIC METABOLIC PANEL
ANION GAP: 11 (ref 5–15)
BUN: 33 mg/dL — ABNORMAL HIGH (ref 6–23)
CHLORIDE: 102 meq/L (ref 96–112)
CO2: 23 mmol/L (ref 19–32)
CREATININE: 1.98 mg/dL — AB (ref 0.50–1.35)
Calcium: 9.1 mg/dL (ref 8.4–10.5)
GFR calc Af Amer: 35 mL/min — ABNORMAL LOW (ref >90)
GFR, EST NON AFRICAN AMERICAN: 30 mL/min — AB (ref >90)
Glucose, Bld: 159 mg/dL — ABNORMAL HIGH (ref 70–99)
Potassium: 3.4 mmol/L — ABNORMAL LOW (ref 3.5–5.1)
Sodium: 136 mmol/L (ref 135–145)

## 2014-07-01 LAB — GLUCOSE, CAPILLARY
GLUCOSE-CAPILLARY: 116 mg/dL — AB (ref 70–99)
Glucose-Capillary: 126 mg/dL — ABNORMAL HIGH (ref 70–99)
Glucose-Capillary: 136 mg/dL — ABNORMAL HIGH (ref 70–99)

## 2014-07-01 MED ORDER — TAMSULOSIN HCL 0.4 MG PO CAPS
0.4000 mg | ORAL_CAPSULE | Freq: Every day | ORAL | Status: DC
  Administered 2014-07-01 – 2014-07-03 (×3): 0.4 mg via ORAL
  Filled 2014-07-01 (×3): qty 1

## 2014-07-01 MED FILL — Heparin Sodium (Porcine) Inj 1000 Unit/ML: INTRAMUSCULAR | Qty: 30 | Status: AC

## 2014-07-01 MED FILL — Sodium Chloride IV Soln 0.9%: INTRAVENOUS | Qty: 2000 | Status: AC

## 2014-07-01 NOTE — Care Management Note (Signed)
CARE MANAGEMENT NOTE 07/01/2014  Patient:  Ricky Holden, COMMON   Account Number:  [id number]  Date Initiated:  07/01/2014  Documentation initiated by:  Oliveras-Aizpurua,Cartel Mauss  Subjective/Objective Assessment:   81 yo male s/p L3-L4, L4-L5 PLIF     Action/Plan:   CM met with pt and family to discuss PT recommendations.   Anticipated DC Date:  07/02/2014   Anticipated DC Plan:  [place]  CM consult      [facility] Choice  HOME HEALTH   Choice offered to / List presented to:  C-1 Patient        [place] arranged  HH-2 PT      [place].   Status of service:  In process, will continue to follow Medicare Important Message given?   (If response is "NO", the following Medicare IM given date fields will be blank) Date Medicare IM given:   Medicare IM given by:   Date Additional Medicare IM given:   Additional Medicare IM given by:    Discharge Disposition:  [place]  Per UR Regulation:    If discussed at Long Length of Stay Meetings, dates discussed:    Comments:  07/01/14 [place] - Norina Buzzard, RN, BSN  Met with pt, wife and daughter. Discharge plan is to return home with the support of his wife, daughter and son-in-law who live nearby. PT is recommending HHPT. Provided wife a list of [facility] agencies in [place]. She prefers to use [facility]. No DME recommended. Will contact [place] for referral.

## 2014-07-01 NOTE — Evaluation (Signed)
Occupational Therapy Evaluation Patient Details Name: Ricky Holden MRN: [id number] DOB: [birth year] Today's Date: 07/01/2014    History of Present Illness s/p multilevel PLIF   Clinical Impression   Pt was performing ADL and mobility at a modified independent level prior to admission.  Presents with LE weakness, impaired balance and pain interfering with independence.  Instructed pt and wife in use of back brace, precautions and safety related to ADL and ADL transfers.  Pt's home is accessible and well equipped and he has good family support.  Will follow acutely.    Follow Up Recommendations  No OT follow up    Equipment Recommendations  None recommended by OT    Recommendations for Other Services       Precautions / Restrictions Precautions Precautions: Back;Fall Precaution Comments: instructed in back precautions related to ADL and IADL Required Braces or Orthoses: Spinal Brace Spinal Brace: Lumbar corset Restrictions Weight Bearing Restrictions: No      Mobility Bed Mobility Overal bed mobility: Needs Assistance Bed Mobility: Rolling;Sidelying to Sit;Sit to Sidelying Rolling: Supervision Sidelying to sit: Supervision     Sit to sidelying: Min assist General bed mobility comments: pt using rail, has rail at home, assist to raise LEs into bed and for alignment  Transfers Overall transfer level: Needs assistance Equipment used: Rolling walker (2 wheeled) Transfers: Sit to/from Stand Sit to Stand: Min assist         General transfer comment: cues for hand placement, min assist to power up    Balance Overall balance assessment: Needs assistance Sitting-balance support: Feet supported Sitting balance-Leahy Scale: Good     Standing balance support: During functional activity Standing balance-Leahy Scale: Fair Standing balance comment: reliant on UEs on walker when urinating                            ADL Overall ADL's : Needs  assistance/impaired Eating/Feeding: Independent   Grooming: Min guard;Wash/dry hands;Standing   Upper Body Bathing: Sitting;Set up   Lower Body Bathing: Sit to/from stand;Min guard (able to cross foot over opposite knee)   Upper Body Dressing : Minimal assistance;Sitting (including back brace)   Lower Body Dressing: Minimal assistance;Sit to/from stand (pt does not wear socks, has slip on shoes) Lower Body Dressing Details (indicate cue type and reason): able to cross foot over opposite knee  Toilet Transfer: Min guard;Ambulation;RW (stood to urinate)   Toileting- Water quality scientist and Hygiene: Minimal assistance;Sit to/from stand       Functional mobility during ADLs: Financial planner     Praxis      Pertinent Vitals/Pain Pain Assessment: 0-10 Pain Score: 5  Pain Location: back Pain Descriptors / Indicators: Sore Pain Intervention(s): Monitored during session;Repositioned;Patient requesting pain meds-RN notified     Hand Dominance Right   Extremity/Trunk Assessment Upper Extremity Assessment Upper Extremity Assessment: Overall WFL for tasks assessed   Lower Extremity Assessment Lower Extremity Assessment: Defer to PT evaluation       Communication Communication Communication: HOH   Cognition Arousal/Alertness: Awake/alert Behavior During Therapy: WFL for tasks assessed/performed Overall Cognitive Status: Within Functional Limits for tasks assessed                     General Comments       Exercises  Shoulder Instructions      Home Living Family/patient expects to be discharged to:: Private residence Living Arrangements: Spouse/significant other;Other relatives Available Help at Discharge: Family;Available 24 hours/day Type of Home: House Home Access: Stairs to enter [employer] of Steps: 4 Entrance Stairs-Rails: Can reach both Home Layout: One level      Bathroom Shower/Tub: Occupational psychologist: Handicapped height     Home Equipment: Environmental consultant - 2 wheels;Cane - single point;Toilet riser;Grab bars - toilet;Grab bars - tub/shower;Shower seat   Additional Comments: Pt's daughter and son-in-law live in attachment to this home. Pt's son-in-law is retired and would be home 24/7 and will be primary assist.       Prior Functioning/Environment Level of Independence: Independent with assistive device(s)        Comments: Used cane and walker for ambulation    OT Diagnosis: Generalized weakness;Acute pain   OT Problem List: Decreased strength;Decreased activity tolerance;Impaired balance (sitting and/or standing);Decreased knowledge of use of DME or AE;Decreased knowledge of precautions;Pain   OT Treatment/Interventions: Self-care/ADL training;DME and/or AE instruction;Therapeutic activities;Patient/family education    OT Goals(Current goals can be found in the care plan section) Acute Rehab OT Goals Patient Stated Goal: Return home OT Goal Formulation: With patient Time For Goal Achievement: 07/08/14 Potential to Achieve Goals: Good ADL Goals Pt Will Perform Grooming: with supervision;standing Pt Will Perform Lower Body Dressing: with supervision;sit to/from stand Pt Will Transfer to Toilet: with supervision;ambulating;grab bars (comfort height) Pt Will Perform Toileting - Clothing Manipulation and hygiene: with supervision;sit to/from stand Pt Will Perform Tub/Shower Transfer: Shower transfer;with supervision;ambulating;shower seat;rolling walker;grab bars Additional ADL Goal #1: Pt will adhere to back precautions during ADL and mobility independently.  OT Frequency: Min 2X/week   Barriers to D/C:            Co-evaluation              End of Session Equipment Utilized During Treatment: Gait belt;Rolling walker;Back brace Nurse Communication: Patient requests pain meds  Activity Tolerance: Patient tolerated  treatment well Patient left: in bed;with call bell/phone within reach;with family/visitor present   Time: 8242-3536 OT Time Calculation (min): 19 min Charges:  OT General Charges $OT Visit: 1 Procedure OT Evaluation $Initial OT Evaluation Tier I: 1 Procedure OT Treatments $Self Care/Home Management : 8-22 mins G-Codes:    [name] 07/01/2014, 2:40 PM  [phone]

## 2014-07-01 NOTE — Evaluation (Signed)
Physical Therapy Evaluation Patient Details Name: Ricky Holden MRN: [id number] DOB: 07-08-1932 Today's Date: 07/01/2014   History of Present Illness   Pt is a 81 year old male s/p L3-L4, L4-L5 PLIF.   Clinical Impression  Pt demonstrating safe mobility with use of RW and ability to recall precautions throughout session when asked. Pt with some trouble during bed mobility not twisting but overall familiar with log roll technique and will do well with continued practice. Pt may benefit from OT consult to address need for adaptive equipment. Pt requires practice negotiating 4 stairs prior to discharge.     Follow Up Recommendations Home health PT;Supervision - Intermittent    Equipment Recommendations  None recommended by PT    Recommendations for Other Services OT consult     Precautions / Restrictions Precautions Precautions: Back;Fall Precaution Booklet Issued: Yes (comment) Precaution Comments: Provided and reviewed throughout session in context of mobility and ADLs Required Braces or Orthoses: Spinal Brace Spinal Brace: Lumbar corset Restrictions Weight Bearing Restrictions: No      Mobility  Bed Mobility Overal bed mobility: Needs Assistance Bed Mobility: Rolling;Sidelying to Sit Rolling: Supervision Sidelying to sit: Min assist       General bed mobility comments: Pt requiring cuing to turn fully onto his side before pushing trunk up to sitting. Pt with some twisting because not in good alignment from rolling. Pt familiar with log roll techqniue as he states he performed it prior to surgery.   Transfers Overall transfer level: Needs assistance Equipment used: Rolling walker (2 wheeled) Transfers: Sit to/from Stand Sit to Stand: Min assist         General transfer comment: Pt min assist to transfer from lowered bed. Pt with cues for hand placement but was reaching for RW prematurely before standing fully.   Ambulation/Gait Ambulation/Gait assistance: Min  guard Ambulation Distance (Feet): 150 Feet Assistive device: Rolling walker (2 wheeled) Gait Pattern/deviations: Step-through pattern;Decreased step length - right;Decreased step length - left;Decreased stride length;Trunk flexed   Gait velocity interpretation: Below normal speed for age/gender General Gait Details: Pt requiring cuing for tall posture with use of RW to maintain back precautions. Pt steady with ambulation and without loss of balance.   Stairs            Wheelchair Mobility    Modified Rankin (Stroke Patients Only)       Balance Overall balance assessment: Needs assistance         Standing balance support: During functional activity;Bilateral upper extremity supported Standing balance-Leahy Scale: Fair Standing balance comment: Pt able to stand statically without support but requires use of RW during dynamic standing activities.                              Pertinent Vitals/Pain Pain Assessment: No/denies pain    Home Living Family/patient expects to be discharged to:: Private residence Living Arrangements: Spouse/significant other;Other relatives Available Help at Discharge: Family Type of Home: House Home Access: Stairs to enter Entrance Stairs-Rails: Can reach both Entrance Stairs-Number of Steps: 4 Home Layout: One level Home Equipment: [place] - 2 wheels;Cane - single point;Toilet riser;Grab bars - toilet;Grab bars - tub/shower;Shower seat (Railing for bed) Additional Comments: Pt's daughter and son-in-law live in attachment to this home. Pt's son-in-law is retired and would be home 24/7 and will be primary assist.     Prior Function Level of Independence: Independent  Comments: Used cane and walker for ambulation     Hand Dominance   Dominant Hand: Right    Extremity/Trunk Assessment               Lower Extremity Assessment: Overall WFL for tasks assessed         Communication   Communication: HOH   Cognition Arousal/Alertness: Awake/alert Behavior During Therapy: WFL for tasks assessed/performed Overall Cognitive Status: Within Functional Limits for tasks assessed                      General Comments General comments (skin integrity, edema, etc.): Pt educated on how to don brace and when to wear it. Pt was able to demonstrate and verbalize understanding.     Exercises        Assessment/Plan    PT Assessment Patient needs continued PT services  PT Diagnosis Acute pain;Difficulty walking   PT Problem List Decreased strength;Decreased activity tolerance;Decreased balance;Decreased mobility;Decreased knowledge of use of DME;Decreased knowledge of precautions;Pain  PT Treatment Interventions DME instruction;Gait training;Stair training;Functional mobility training;Therapeutic activities;Therapeutic exercise;Balance training;Patient/family education   PT Goals (Current goals can be found in the Care Plan section) Acute Rehab PT Goals Patient Stated Goal: Return home PT Goal Formulation: With patient Time For Goal Achievement: 07/08/14 Potential to Achieve Goals: Good    Frequency Min 5X/week   Barriers to discharge        Co-evaluation               End of Session Equipment Utilized During Treatment: Gait belt Activity Tolerance: Patient tolerated treatment well Patient left: in chair;with call bell/phone within reach;with family/visitor present Nurse Communication: Mobility status         Time: 9373-4287 PT Time Calculation (min) (ACUTE ONLY): 29 min   Charges:   PT Evaluation $Initial PT Evaluation Tier I: 1 Procedure PT Treatments $Gait Training: 8-22 mins   PT G CodesJearld Shines SPT 07/01/2014, 9:59 AM  Jearld Shines, SPT  Acute Rehabilitation [phone]

## 2014-07-01 NOTE — Progress Notes (Signed)
Patient ID: Ricky Holden, male   DOB: [birth year], 81 y.o.   MRN: [id number] Subjective:  The patient is alert and pleasant. He is in no apparent distress. I discussed a rehabilitation consult with him. He is not presently interested.  Objective: Vital signs in last 24 hours: Temp:  [97.5 F (36.4 C)-99 F (37.2 C)] 98.6 F (37 C) (01/08 0806) Pulse Rate:  [76-97] 81 (01/08 0806) Resp:  [11-41] 16 (01/08 0806) BP: (93-146)/(57-81) 104/61 mmHg (01/08 0806) SpO2:  [90 %-99 %] 96 % (01/08 0806)  Intake/Output from previous day: 01/07 0701 - 01/08 0700 In: 3300 [I.V.:2850; Blood:200; IV Piggyback:250] Out: 2400 [Urine:1275; Drains:525; Blood:600] Intake/Output this shift:    Physical exam the patient is alert and oriented. He is moving his lower extremities well.  Lab Results:  Recent Labs  06/30/14 0640 07/01/14 0251  WBC  --  8.2  HGB 14.6 10.0*  HCT 43.0 29.2*  PLT  --  136*   BMET  Recent Labs  06/30/14 0640 07/01/14 0251  NA 140 136  K 3.2* 3.4*  CL 102 102  CO2  --  23  GLUCOSE 109* 159*  BUN 38* 33*  CREATININE 2.00* 1.98*  CALCIUM  --  9.1    Studies/Results: Dg Lumbar Spine 2-3 Views  06/30/2014   CLINICAL DATA:  L3 through L5 PLIF.  EXAM: DG C-ARM 61-120 MIN; LUMBAR SPINE - 2-3 VIEW  COMPARISON:  06/30/2014 at 0827 hr.  FINDINGS: L3 through L5 posterior lumbar interbody fusion in progress with pedicle screws at these levels and L3-L4 and L4-L5 discectomy with interbody spacer. This is demonstrated on AP and lateral fluoroscopic spot views.  IMPRESSION: Intraoperative localization from L3 through L5.  L3 through L5 PLIF.   Electronically Signed   By: Dereck Ligas M.D.   On: 06/30/2014 12:47   Dg Lumbar Spine 1 View  06/30/2014   CLINICAL DATA:  Spinal stenosis.  Spondylolisthesis.  EXAM: LUMBAR SPINE - 1 VIEW  COMPARISON:  MRI dated 10/30/2013  FINDINGS: Instruments are at the L4-5 level.  IMPRESSION: Instruments at L4-5.   Electronically Signed   By:  Rozetta Nunnery M.D.   On: 06/30/2014 12:49   Dg C-arm 61-120 Min  06/30/2014   CLINICAL DATA:  L3 through L5 PLIF.  EXAM: DG C-ARM 61-120 MIN; LUMBAR SPINE - 2-3 VIEW  COMPARISON:  06/30/2014 at 0827 hr.  FINDINGS: L3 through L5 posterior lumbar interbody fusion in progress with pedicle screws at these levels and L3-L4 and L4-L5 discectomy with interbody spacer. This is demonstrated on AP and lateral fluoroscopic spot views.  IMPRESSION: Intraoperative localization from L3 through L5.  L3 through L5 PLIF.   Electronically Signed   By: Dereck Ligas M.D.   On: 06/30/2014 12:47    Assessment/Plan: Postop day #1: We will mobilize the patient. Has had quite a bit of output out of his drain so I'll continue until tomorrow.  LOS: 1 day     Savita Runner D 07/01/2014, 9:33 AM

## 2014-07-02 LAB — GLUCOSE, CAPILLARY: Glucose-Capillary: 125 mg/dL — ABNORMAL HIGH (ref 70–99)

## 2014-07-02 MED ORDER — OXYCODONE-ACETAMINOPHEN 5-325 MG PO TABS: 1.0000 | ORAL_TABLET | ORAL | Status: AC | PRN

## 2014-07-02 MED ORDER — DIAZEPAM 2 MG PO TABS: 2.0000 mg | ORAL_TABLET | Freq: Four times a day (QID) | ORAL | Status: AC | PRN

## 2014-07-02 MED ORDER — TAMSULOSIN HCL 0.4 MG PO CAPS: 0.4000 mg | ORAL_CAPSULE | Freq: Every day | ORAL | Status: AC

## 2014-07-02 MED ORDER — DIAZEPAM 2 MG PO TABS: 2.0000 mg | ORAL_TABLET | Freq: Four times a day (QID) | ORAL | Status: DC | PRN

## 2014-07-02 MED ORDER — DSS 100 MG PO CAPS: 100.0000 mg | ORAL_CAPSULE | Freq: Two times a day (BID) | ORAL | Status: AC

## 2014-07-02 NOTE — Progress Notes (Signed)
Pt arrived to unit per wheelchair by 3c RN, Caryl Pina. No acute distress noted.  will monitor pt closely.   Ricky Holden I 07/02/2014 10:17 AM

## 2014-07-02 NOTE — Progress Notes (Signed)
OT Cancellation Note  Patient Details Name: Ricky Holden MRN: [id number] DOB: 1933/06/20   Cancelled Treatment:    Reason Eval/Treat Not Completed: Pain limiting ability to participate. Just seen by PT.  Will try back.  [name] 07/02/2014, 9:57 AM

## 2014-07-02 NOTE — Care Management Note (Signed)
Comments:  07/02/14 1030 - Frann Rider, RN, BSN  Contacted [place] at [facility]: Lambert Rhonda W for [place] referral.

## 2014-07-02 NOTE — Progress Notes (Signed)
Occupational Therapy Treatment Patient Details Name: Ricky Holden MRN: [id number] DOB: 1933/04/29 Today's Date: 07/02/2014    History of present illness s/p multilevel PLIF   OT comments  Focus of session on standing ADL and toileting.  Educated pt in use of toilet aide to avoid twisting. Pt will need close supervision for all mobility at home and to monitor pt's adherence to back precautions. Pt moves very slowly with posture decreasing with fatigue.  Follow Up Recommendations  No OT follow up    Equipment Recommendations  None recommended by OT    Recommendations for Other Services      Precautions / Restrictions Precautions Precautions: Back;Fall Precaution Comments: pt able to state 2/3 back precautions (not arching) Required Braces or Orthoses: Spinal Brace Spinal Brace: Lumbar corset;Applied in sitting position       Mobility Bed Mobility   Bed Mobility: Rolling;Sidelying to Sit;Sit to Sidelying Rolling: Supervision Sidelying to sit: Supervision (with rail, bed flat)     Sit to sidelying: Min guard General bed mobility comments: used rail, extra time, no physical assist  Transfers   Equipment used: Rolling walker (2 wheeled) Transfers: Sit to/from Stand Sit to Stand: Min guard;Supervision (min guard from bed, supervision from toilet)         General transfer comment: cues for hand placement and to avoid twisting with walker,    Balance                                   ADL Overall ADL's : Needs assistance/impaired     Grooming: Wash/dry hands;Supervision/safety;Standing               Lower Body Dressing: Supervision/safety;Sit to/from stand   Toilet Transfer: Supervision/safety;Ambulation;Comfort height toilet;Grab bars   Toileting- Clothing Manipulation and Hygiene: Supervision/safety;Sit to/from stand Toileting - Clothing Manipulation Details (indicate cue type and reason): instructed to avoid twisting and in use of AE for  [facility] Tub/ Shower Transfer: Walk-in shower;Supervision/safety;Ambulation;Shower seat;Grab Engineer, agricultural Details (indicate cue type and reason): wife will supervise at home Functional mobility during ADLs: Supervision/safety;Rolling walker        Vision                     Perception     Praxis      Cognition   Behavior During Therapy: [facility] for tasks assessed/performed Overall Cognitive Status: Within Functional Limits for tasks assessed                       Extremity/Trunk Assessment               Exercises     Shoulder Instructions       General Comments      Pertinent Vitals/ Pain       Pain Assessment: Faces Faces Pain Scale: Hurts little more Pain Location: back Pain Descriptors / Indicators: Sore Pain Intervention(s): Monitored during session;Premedicated before session;Repositioned  Home Living                                          Prior Functioning/Environment              Frequency Min 2X/week     Progress Toward Goals  OT Goals(current goals can now be found in the care  plan section)  Progress towards OT goals: Progressing toward goals  Acute Rehab OT Goals Patient Stated Goal: get home  Plan Discharge plan remains appropriate    Co-evaluation                 End of Session Equipment Utilized During Treatment: Gait belt;Rolling walker;Back brace   Activity Tolerance Patient tolerated treatment well   Patient Left in bed;with call bell/phone within reach;with family/visitor present   Nurse Communication          Time: 3953-2023 OT Time Calculation (min): 25 min  Charges: OT General Charges $OT Visit: 1 Procedure OT Treatments $Self Care/Home Management : 23-37 mins  [name] 07/02/2014, 3:40 PM

## 2014-07-02 NOTE — Progress Notes (Signed)
Physical Therapy Treatment Patient Details Name: Ricky Holden MRN: [id number] DOB: [birth year] Today's Date: 07/02/2014    History of Present Illness s/p multilevel PLIF    PT Comments    Pt has been up to stairs today after having a PLIF this week.  Family in to observe and wife came with pt to see stairs.  Pt very unsteady by end of stair descent and seems much more fatigued by early morning multiple gait trips.  Will continue to see in PT as he is not ready to discharge yet per his decision.  Family still planning to take him home.  Follow Up Recommendations  Home health PT;Supervision - Intermittent     Equipment Recommendations  None recommended by PT    Recommendations for Other Services       Precautions / Restrictions Precautions Precautions: Back;Fall Precaution Booklet Issued: Yes (comment) Precaution Comments: instructed back precautions regarding lifting and body mechanics, Required Braces or Orthoses: Spinal Brace Spinal Brace: Lumbar corset Restrictions Weight Bearing Restrictions: No    Mobility  Bed Mobility Overal bed mobility: Needs Assistance Bed Mobility: Sidelying to Sit;Rolling Rolling: Min guard Sidelying to sit: Min assist       General bed mobility comments: uses railing but bed was level as it should be with precautionws  Transfers Overall transfer level: Needs assistance Equipment used: Rolling walker (2 wheeled) Transfers: Sit to/from [facility] Sit to Stand: Min assist Stand pivot transfers: Min assist       General transfer comment: cues for hand placement, min assist to power up  Ambulation/Gait Ambulation/Gait assistance: Min guard Ambulation Distance (Feet): 150 Feet Assistive device: Rolling walker (2 wheeled) Gait Pattern/deviations: Step-through pattern;Decreased dorsiflexion - right;Decreased dorsiflexion - left;Antalgic;Wide base of support Gait velocity: reduced Gait velocity interpretation: Below  normal speed for age/gender General Gait Details: postural correction cues esp needed after stairs, pt very tired from [place] but nursing reports he has been up walking since early AM   Stairs Stairs: Yes Stairs assistance: Mod assist Stair Management: One rail Right;Step to pattern;Forwards;Sideways (sideways down and forward up) Number of Stairs: 4 General stair comments: pt looked more unsteady at the end of the task and may even have been anxious, needed reminding for upright standing and to use safe technique and stand up fully at bottom of stairs.  Information systems manager mobility: Yes Wheelchair Assistance Details (indicate cue type and reason): positioning instructed including prop for upright posture  Modified Rankin (Stroke Patients Only)       Balance Overall balance assessment: Needs assistance Sitting-balance support: Feet supported Sitting balance-Leahy Scale: Good   Postural control: Posterior lean Standing balance support: Bilateral upper extremity supported Standing balance-Leahy Scale: Fair Standing balance comment: unsteady with walker after stair climbing                    Cognition Arousal/Alertness: Awake/alert Behavior During Therapy: WFL for tasks assessed/performed Overall Cognitive Status: Within Functional Limits for tasks assessed                      Exercises      General Comments General comments (skin integrity, edema, etc.): Pt was in bed with family when PT entered, has been able to get up with help and needs assistance for all mobility.  PT will need to follow him further until DC which won't be until at least tomorrow      Pertinent Vitals/Pain Pain Assessment: 0-10 Pain Score:  7  Pain Location: back Pain Descriptors / Indicators: Sore Pain Intervention(s): Limited activity within patient's tolerance;Monitored during session;RN gave pain meds during session;Other (comment) (reinstructed  use of brace for maximum comfort)    Home Living                      Prior Function            PT Goals (current goals can now be found in the care plan section) Acute Rehab PT Goals Patient Stated Goal: get home Progress towards PT goals: Progressing toward goals    Frequency  Min 5X/week    PT Plan Current plan remains appropriate    Co-evaluation             End of Session Equipment Utilized During Treatment: Other (comment);Back brace (FWW) Activity Tolerance: Patient limited by pain Patient left: in chair;with call bell/phone within reach;with nursing/sitter in room;with family/visitor present     Time: 1610-9604 PT Time Calculation (min) (ACUTE ONLY): 23 min  Charges:  $Gait Training: 8-22 mins $Therapeutic Activity: 8-22 mins                    G Codes:      Ramond Dial 07/19/14, 10:54 AM   Mee Hives, PT MS Acute Rehab Dept. Number: [phone]

## 2014-07-02 NOTE — Discharge Summary (Signed)
  Physician Discharge Summary  Patient ID: Ricky Holden MRN: [id number] DOB/AGE: 79-Nov-1934 79 y.o.  Admit date: 06/30/2014 Discharge date: 07/02/2014  Admission Diagnoses: L3-4 and L4-5 spondylolisthesis, spinal stenosis, lumbago, lumbar radiculopathy, neurogenic claudication, cervical myelopathy, urinary retention  Discharge Diagnoses: The same Active Problems:   Spondylolisthesis of lumbar region   Discharged Condition: good  Hospital Course: I performed an L3-4 and L4-5 decompression, instrumentation, and fusion on the patient on 06/30/2014. The surgery went well.  The patient's postoperative course was only remarkable for some urinary retention. He was started on Flomax and his urinary retention resolved. On postoperative day #2 the patient requested discharge to home. He was given oral and written discharge instructions. All his questions were answered.  Consults: PT Significant Diagnostic Studies: None Treatments: L3-4 and L4-5 decompression, instrumentation, and fusion. Discharge Exam: Blood pressure 119/68, pulse 91, temperature 97.8 F (36.6 C), temperature source Oral, resp. rate 18, SpO2 96 %. The patient is alert and pleasant. He looks well. His strength is grossly normal in his lower extremities.  Disposition: Home     Medication List    STOP taking these medications        celecoxib 200 MG capsule  Commonly known as:  CELEBREX     HYDROcodone-acetaminophen 10-325 MG per tablet  Commonly known as:  NORCO      TAKE these medications        allopurinol 300 MG tablet  Commonly known as:  ZYLOPRIM  Take 300 mg by mouth daily.     atenolol-chlorthalidone 50-25 MG per tablet  Commonly known as:  TENORETIC  Take 0.5 tablets by mouth daily.     atorvastatin 40 MG tablet  Commonly known as:  LIPITOR  Take 20 mg by mouth daily.     diazepam 2 MG tablet  Commonly known as:  VALIUM  Take 1 tablet (2 mg total) by mouth every 6 (six) hours as needed for  muscle spasms.     DSS 100 MG Caps  Take 100 mg by mouth 2 (two) times daily.     DSS 100 MG Caps  Take 100 mg by mouth 2 (two) times daily.     fenofibrate 160 MG tablet  Take 160 mg by mouth daily.     linagliptin 5 MG Tabs tablet  Commonly known as:  TRADJENTA  Take 5 mg by mouth daily.     oxyCODONE-acetaminophen 5-325 MG per tablet  Commonly known as:  PERCOCET/ROXICET  Take 1-2 tablets by mouth every 4 (four) hours as needed for moderate pain.     rOPINIRole 0.5 MG tablet  Commonly known as:  REQUIP  Take 0.5 mg by mouth at bedtime.     tamsulosin 0.4 MG Caps capsule  Commonly known as:  FLOMAX  Take 1 capsule (0.4 mg total) by mouth daily.         SignedNewman Pies D 07/02/2014, 8:50 AM

## 2014-07-02 NOTE — Progress Notes (Signed)
Report recd from [place], [place] in 3 C. Will monitor for pt's arrival to unit   Angeline Slim I 07/02/2014 9:58 AM

## 2014-07-02 NOTE — Progress Notes (Signed)
Report called to 4N. Pt transferred to 4N05 per MD order. Pt is stable at transfer. Holli Humbles, RN

## 2014-07-03 NOTE — Progress Notes (Signed)
Physical Therapy Treatment Patient Details Name: Ricky Holden MRN: [id number] DOB: 17-May-1933 Today's Date: 07/03/2014    History of Present Illness s/p multilevel PLIF    PT Comments    Pt presents this session with significant limitations which may present serious safety risk if patient is discharged to home.  Appears to be performing mobility today with less independence and more difficulty.  Pt with improved pain report of 4/10, however with significant weakness and limited ROM resulting in difficulty walking with RW and requiring constant physical assist as well as several standing rest breaks to cover 50 feet.  Following short walk pt symptomatic for orthostasis and BP assessed in sitting to be 79/53; returned to bed and BP improved to 117/66 with improvement in symptoms as well.    Discussed with patient concerns for safety, difficulty getting around, and need for constant assistance for any mobility tasks.  Strongly recommend consider SNF for postacute rehab needs in order to improve strength, ability to sustain effort over time to complete task and decrease need for physical assistance at home.  SNF would provide higher volume of therapy vs. HHPT and may decrease risk for rehospitalization due to fall or injury sustained from fall.  RN updated.  Verbally updates surgeon as well as daughter and spouse regarding above.  Strongly discouraged home d/c.  Family chooses to take patient home.  Advised family and patient to ask for increase frequency of visits once home.  D/c planned for today.   Follow Up Recommendations  SNF;Supervision/Assistance - 24 hour     Equipment Recommendations       Recommendations for Other Services       Precautions / Restrictions Precautions Precautions: Back;Fall Required Braces or Orthoses: Spinal Brace Spinal Brace: Lumbar corset;Applied in sitting position Restrictions Weight Bearing Restrictions: No    Mobility  Bed Mobility Overal bed  mobility: Needs Assistance Bed Mobility: Rolling;Sidelying to Sit;Sit to Sidelying Rolling: Min assist Sidelying to sit: Min assist     Sit to sidelying: Min assist General bed mobility comments: dependent on rail, needs verbal cues for sequencing safest technique, physical assist to roll hips and shoulders together and to position hands to push up as well as to assist trunk into sitting  Transfers Overall transfer level: Needs assistance Equipment used: Rolling walker (2 wheeled) Transfers: Sit to/from Stand Sit to Stand: Min assist Stand pivot transfers: Min assist       General transfer comment: cues to scoot out to edge of surface and physical assist to support while transitioning hands from pushing surface to RW handles; hands on at all times for safety with flexed posture in trunk, hips and knees throughout all activities despite cues; concern for orthostasis, see BP measures in general comments and clinical impression  Ambulation/Gait Ambulation/Gait assistance: Min assist Ambulation Distance (Feet): 50 Feet Assistive device: Rolling walker (2 wheeled) Gait Pattern/deviations: Step-through pattern;Decreased stride length;Shuffle;Antalgic;Trunk flexed;Narrow base of support Gait velocity: 0.64 ft/sec  Gait velocity interpretation: <1.8 ft/sec, indicative of risk for recurrent falls General Gait Details: as noted, pt remains flexed in trunk, hips and knees; Cues and exercise to terminal knee extension difficult for patient to perform and zero carryover to gait; needs frequent cues and hands-on assist at all times, takes several standing rest breaks with increasing frequency over time; fatigued, diaphoretic and pale by end of walk; BP assessed after 2-3 min seated rest to be 79/53; moved to bed to semi recumbent and BP = 117/66 with patient endorsing feeling better.  Stairs            Wheelchair Mobility    Modified Rankin (Stroke Patients Only)       Balance Overall  balance assessment: Needs assistance Sitting-balance support: No upper extremity supported;Feet supported Sitting balance-Leahy Scale: Poor Sitting balance - Comments: while donning brace seated EOB pt tends to lean to right and unweight left hip; able to prevent LOB but needs increased effort and cues to attend to stability during task Postural control: Left lateral lean (seen in sitting) Standing balance support: Bilateral upper extremity supported;During functional activity Standing balance-Leahy Scale: Poor Standing balance comment: dependent on RW and braces legs against bed upon initial standing; heavily dependent on walker through UE's during dynamic activities                    Cognition Arousal/Alertness: Awake/alert Behavior During Therapy: WFL for tasks assessed/performed Overall Cognitive Status: Within Functional Limits for tasks assessed                      Exercises General Exercises - Lower Extremity Ankle Circles/Pumps: AROM;Both;20 reps;Supine Quad Sets: AROM;Both;20 reps;Supine    General Comments General comments (skin integrity, edema, etc.): As documented in gait section, pt diaphoretic and pale after walk; BP assessed in sitting to be 79/53, recheck in sitting 74/48; Assisted to bed, HOB to 15 degrees, BP = 117/66.  RN/CNA in room for assessment, updated on this PT's concerns (see impression statement)      Pertinent Vitals/Pain Pain Assessment: 0-10 Pain Score: 4  Pain Location: back Pain Descriptors / Indicators: Sore Pain Intervention(s): Limited activity within patient's tolerance;Monitored during session (meds given 2 hours before session)    Home Living                      Prior Function            PT Goals (current goals can now be found in the care plan section) Progress towards PT goals: Not progressing toward goals - comment (limited by weakness and symptomatic orthostasis)    Frequency  Min 5X/week    PT Plan  Discharge plan needs to be updated    Co-evaluation             End of Session Equipment Utilized During Treatment: Back brace;Gait belt Activity Tolerance: Patient limited by fatigue Patient left: in bed;with call bell/phone within reach;with bed alarm set;with nursing/sitter in room     Time: 0912-0938 PT Time Calculation (min) (ACUTE ONLY): 26 min  Charges:  $Gait Training: 8-22 mins $Therapeutic Activity: 8-22 mins                    G Codes:      Herbie Drape 07/03/2014, 9:56 AM

## 2014-07-03 NOTE — Discharge Summary (Signed)
  Subjective:  The patient is alert and pleasant. He is accompanied by his wife and daughter. He wants to go home.  Objective: Vital signs in last 24 hours: Temp:  [97.6 F (36.4 C)-98.8 F (37.1 C)] 98.6 F (37 C) (01/10 0900) Pulse Rate:  [85-96] 93 (01/10 0900) Resp:  [16-18] 18 (01/10 0900) BP: (104-117)/(54-67) 117/66 mmHg (01/10 0900) SpO2:  [92 %-97 %] 96 % (01/10 0900)  Intake/Output from previous day: 01/09 0701 - 01/10 0700 In: 240 [P.O.:240] Out: 1200 [Urine:1200] Intake/Output this shift: Total I/O In: -  Out: 250 [Urine:250]  Physical exam the patient is alert and oriented 3. Strength is grossly normal in his lower extremities.  Lab Results:  Recent Labs  07/01/14 0251  WBC 8.2  HGB 10.0*  HCT 29.2*  PLT 136*   BMET  Recent Labs  07/01/14 0251  NA 136  K 3.4*  CL 102  CO2 23  GLUCOSE 159*  BUN 33*  CREATININE 1.98*  CALCIUM 9.1    Studies/Results: No results found.  Assessment/Plan: Postop day #3: The patient is slowly progressing well. We discussed rehabilitation versus skilled facility versus going home with PT and OT. The patient was not interested in a skilled nursing facility or inpatient rehabilitation. He appears to have adequate help at home with his daughter, son-in-law, and wife. I have answered all their questions. We'll discharge him home and I'll see him in a few weeks in the office.  LOS: 3 days     Ricky Holden D 07/03/2014, 11:21 AM

## 2014-07-03 NOTE — Progress Notes (Signed)
Reviewed pt discharge instructions with patient and spouse. Both verbalized understanding. 2 rx given and others called into pharmacy. Pt currently has necessary DME at home. Transported to front lobby by nurse tech via wheelchair, and daughter provided transportation to the patient's home

## 2015-01-17 ENCOUNTER — Ambulatory Visit: Payer: Medicare Other | Attending: Orthopedic Surgery | Admitting: Physical Therapy

## 2015-01-17 DIAGNOSIS — M25611 Stiffness of right shoulder, not elsewhere classified: Secondary | ICD-10-CM | POA: Diagnosis present

## 2015-01-17 DIAGNOSIS — M25511 Pain in right shoulder: Secondary | ICD-10-CM | POA: Diagnosis present

## 2015-01-17 NOTE — Therapy (Signed)
[facility] [place], [place], [address] Phone: [phone]   Fax:  [phone]  Physical Therapy Evaluation  Patient Details  Name: Ricky Holden MRN: [id number] Date of Birth: Apr 30, 1933 Referring Provider:  Earlie Server, MD  Encounter Date: 01/17/2015      PT End of Session - 01/17/15 1224    Visit Number 1   Number of Visits 12   Date for PT Re-Evaluation 02/28/15   PT Start Time 1034   PT Stop Time 1103   PT Time Calculation (min) 29 min   Activity Tolerance Patient tolerated treatment well   Behavior During Therapy [facility] for tasks assessed/performed      Past Medical History  Diagnosis Date  . Cancer     Prostate cancer - radiation  . Hypertension   . High cholesterol   . Arthritis   . Bell's palsy   . Cataract     has had surgery  . Diabetes mellitus without complication     type 2  . Chronic kidney disease     Cr 1.40 - 2.0 from ~ 2010 - 2015    Past Surgical History  Procedure Laterality Date  . Eye surgery Left     cataract surgery with lens implant  . Tonsillectomy    . Tympanoplasty Left     wears hearing aids  . Colonoscopy    . Anterior cervical decomp/discectomy fusion N/A 01/24/2014    Procedure: ANTERIOR CERVICAL DECOMPRESSION/DISCECTOMY FUSION 3 LEVELS Cervical three/four,four/five, five/six  anterior cervical decompression with fusion interbody prosthesis plating and bonegraft;  Surgeon: Newman Pies, MD;  Location: [place] [facility];  Service: Neurosurgery;  Laterality: N/A;    There were no vitals filed for this visit.  Visit Diagnosis:  Right shoulder pain - Plan: PT plan of care cert/re-cert  Shoulder stiffness, right - Plan: PT plan of care cert/re-cert      Subjective Assessment - 01/17/15 1223    Subjective right shoulder started to hurt approximately one year ago.   Patient Stated Goals Use right UE without pain.   Currently in Pain? Yes   Pain Score 6    Pain  Location Shoulder   Pain Orientation Right   Pain Descriptors / Indicators Aching   Pain Type Chronic pain   Pain Onset More than a month ago   Pain Frequency Constant   Aggravating Factors  Lifting right UE.   Pain Relieving Factors Rest.            [facility] PT Assessment - 01/17/15 0001    Assessment   Medical Diagnosis Right shoulder pain.   Onset Date/Surgical Date --  One year.   Precautions   Precautions None   Restrictions   Weight Bearing Restrictions No   Balance Screen   Has the patient fallen in the past 6 months No   Has the patient had a decrease in activity level because of a fear of falling?  Yes   Is the patient reluctant to leave their home because of a fear of falling?  No   Home [employer] residence   Prior Function   Level of Independence Independent   Cognition   Overall Cognitive Status Within Functional Limits for tasks assessed   Posture/Postural Control   Posture/Postural Control Postural limitations   Postural Limitations Rounded Shoulders;Forward head   Posture Comments and rounded shoulders.   ROM / Strength   AROM / PROM / Strength AROM;Strength  AROM   Overall AROM Comments Right shoulder flexion= 110 degrees; ER= 45 degrees and IR= 25 degrees.   Strength   Overall Strength Comments Patient weakest into right shoulder ER which is 3+ to 4-/5.   Palpation   Palpation comment CC is that of referred pain into his right middle deltoid region.   Special Tests    Special Tests --  Positive right shoulder Impingement test.                             PT Short Term Goals - 02-15-15 1229    PT SHORT TERM GOAL #1   Title Independent with an HEP.   Time 2   Period Weeks   Status New           PT Long Term Goals - 02-15-15 1230    PT LONG TERM GOAL #1   Title Active right shoulder flexion to 145 degrees so the patient can easily reach overhead   Time 4   Period Weeks   Status New    PT LONG TERM GOAL #2   Title Active ER to 70 degrees+ to allow for easily donning/doffing of apparel   Time 4   Period Weeks   Status New   PT LONG TERM GOAL #3   Title Increase ROM so patient is able to reach behind back to L3 with right hand.   Time 4   Period Weeks   Status New   PT LONG TERM GOAL #4   Title Increase shoulder strength to a solid 4+/5 to increase stability for performance of functional activities   Time 4   Period Weeks   Status New   PT LONG TERM GOAL #5   Title Perform ADL's with pain not > 3/10 and no referred pain.   Time 4   Period Weeks   Status New               Plan - 02/15/2015 1225    Clinical Impression Statement The patient reports his right shoulder began to hurt about one year ago.  He wishes to be evaluated today and then returns to his MD next week and plans to discuss how he shoulder prceed.  Right shoulder pain increases to 5-6/10 with end-range movements.   Pt will benefit from skilled therapeutic intervention in order to improve on the following deficits Pain;Decreased strength;Decreased range of motion;Decreased activity tolerance   Rehab Potential Good   PT Frequency 3x / week   PT Duration 4 weeks   PT Treatment/Interventions Electrical Stimulation;Ultrasound;Therapeutic exercise;Manual techniques;Patient/family education   PT Next Visit Plan 1-1 right shoulder PROM and capsular stretching.  AAROM; UE Ranger   Consulted and Agree with Plan of Care Patient          G-Codes - 15-Feb-2015 1236    Functional Assessment Tool Used FOTO.   Functional Limitation Mobility: Walking and moving around   Mobility: Walking and Moving Around Current Status [phone]) At least 20 percent but less than 40 percent impaired, limited or restricted   Mobility: Walking and Moving Around Goal Status [phone]) At least 1 percent but less than 20 percent impaired, limited or restricted       Problem List Patient Active Problem List   Diagnosis Date Noted   . Spondylolisthesis of lumbar region 06/30/2014  . Cervical spondylosis with myelopathy 01/24/2014    Njeri Vicente, [place] MPT February 15, 2015, 12:38 PM  [facility] [place], [place], [address] Phone: [phone]   Fax:  [phone]

## 2015-02-07 ENCOUNTER — Encounter: Payer: Self-pay | Admitting: *Deleted

## 2015-02-07 ENCOUNTER — Ambulatory Visit: Payer: Medicare Other | Attending: Orthopedic Surgery | Admitting: *Deleted

## 2015-02-07 DIAGNOSIS — M25511 Pain in right shoulder: Secondary | ICD-10-CM | POA: Insufficient documentation

## 2015-02-07 DIAGNOSIS — M25611 Stiffness of right shoulder, not elsewhere classified: Secondary | ICD-10-CM | POA: Insufficient documentation

## 2015-02-07 NOTE — Therapy (Signed)
[facility] [place], [place], [address] Phone: [phone]   Fax:  [phone]  Physical Therapy Treatment  Patient Details  Name: Ricky Holden MRN: [id number] Date of Birth: 18-Nov-1932 Referring Provider:  Christain Sacramento, MD  Encounter Date: 02/07/2015      PT End of Session - 02/07/15 1359    Visit Number 2   Number of Visits 12   Date for PT Re-Evaluation 02/28/15   PT Start Time 0998   PT Stop Time 1433   PT Time Calculation (min) 46 min      Past Medical History  Diagnosis Date  . Cancer     Prostate cancer - radiation  . Hypertension   . High cholesterol   . Arthritis   . Bell's palsy   . Cataract     has had surgery  . Diabetes mellitus without complication     type 2  . Chronic kidney disease     Cr 1.40 - 2.0 from ~ 2010 - 2015    Past Surgical History  Procedure Laterality Date  . Eye surgery Left     cataract surgery with lens implant  . Tonsillectomy    . Tympanoplasty Left     wears hearing aids  . Colonoscopy    . Anterior cervical decomp/discectomy fusion N/A 01/24/2014    Procedure: ANTERIOR CERVICAL DECOMPRESSION/DISCECTOMY FUSION 3 LEVELS Cervical three/four,four/five, five/six  anterior cervical decompression with fusion interbody prosthesis plating and bonegraft;  Surgeon: Newman Pies, MD;  Location: [place] [facility];  Service: Neurosurgery;  Laterality: N/A;    There were no vitals filed for this visit.  Visit Diagnosis:  Right shoulder pain  Shoulder stiffness, right      Subjective Assessment - 02/07/15 1356    Subjective right shoulder started to hurt approximately one year ago. Having surgery 03-01-2015   Patient Stated Goals Use right UE without pain.   Currently in Pain? Yes   Pain Score 6    Pain Location Shoulder   Pain Orientation Right   Pain Descriptors / Indicators Aching   Pain Type Chronic pain   Pain Onset More than a month ago   Aggravating Factors   Lifting RT UE   Pain Relieving Factors rest                         [facility] Adult PT Treatment/Exercise - 02/07/15 0001    Exercises   Exercises Shoulder   Shoulder Exercises: Supine   Other Supine Exercises HEP LLLDS for ER using soup can   Shoulder Exercises: Seated   Other Seated Exercises Table top flexion/extension, circles each way for HEP   Other Seated Exercises HEP- Pt used his cane and performed Flexion/extension and circles while seated   Shoulder Exercises: Pulleys   Flexion --  UE RANGER- flexion/extension, and circles each way 3x15   Manual Therapy   Manual Therapy Passive ROM   Passive ROM PROM to RT shldr for IR, ER, and elevation with low load end-range holds. Shldr blade mobs and inferior GHJ glides                PT Education - 02/07/15 1455    Education provided Yes   Education Details seated cane exs,table topslides and ER stretch   Person(s) Educated Patient   Methods Explanation;Demonstration;Tactile cues;Verbal cues   Comprehension Verbalized understanding;Returned demonstration          PT Short Term Goals -  01/17/15 1229    PT SHORT TERM GOAL #1   Title Independent with an HEP.   Time 2   Period Weeks   Status New           PT Long Term Goals - 01/17/15 1230    PT LONG TERM GOAL #1   Title Active right shoulder flexion to 145 degrees so the patient can easily reach overhead   Time 4   Period Weeks   Status New   PT LONG TERM GOAL #2   Title Active ER to 70 degrees+ to allow for easily donning/doffing of apparel   Time 4   Period Weeks   Status New   PT LONG TERM GOAL #3   Title Increase ROM so patient is able to reach behind back to L3 with right hand.   Time 4   Period Weeks   Status New   PT LONG TERM GOAL #4   Title Increase shoulder strength to a solid 4+/5 to increase stability for performance of functional activities   Time 4   Period Weeks   Status New   PT LONG TERM GOAL #5   Title Perform ADL's  with pain not > 3/10 and no referred pain.   Time 4   Period Weeks   Status New               Plan - 02/07/15 1400    Clinical Impression Statement Pt did well with Rx today and was able to perform Exs without increased pain. He needed VC's to relax during manual techniques and did well after relaxing. Pt did well with HEP. Goals are ongoing   Pt will benefit from skilled therapeutic intervention in order to improve on the following deficits Pain;Decreased strength;Decreased range of motion;Decreased activity tolerance   Rehab Potential Good   PT Frequency 3x / week   PT Duration 4 weeks   PT Treatment/Interventions Electrical Stimulation;Ultrasound;Therapeutic exercise;Manual techniques;Patient/family education   PT Next Visit Plan 1-1 right shoulder PROM and capsular stretching.  AAROM; UE Ranger        Problem List Patient Active Problem List   Diagnosis Date Noted  . Spondylolisthesis of lumbar region 06/30/2014  . Cervical spondylosis with myelopathy 01/24/2014    RAMSEUR,CHRIS, PTA 02/07/2015, 3:05 PM  [facility] [address] [place], [place], [address] Phone: [phone]   Fax:  [phone]

## 2015-02-10 ENCOUNTER — Ambulatory Visit: Payer: Medicare Other | Admitting: *Deleted

## 2015-02-10 ENCOUNTER — Encounter: Payer: Self-pay | Admitting: *Deleted

## 2015-02-10 DIAGNOSIS — M25511 Pain in right shoulder: Secondary | ICD-10-CM | POA: Diagnosis not present

## 2015-02-10 DIAGNOSIS — M25611 Stiffness of right shoulder, not elsewhere classified: Secondary | ICD-10-CM

## 2015-02-10 NOTE — Therapy (Signed)
[facility] [place], [place], [address] Phone: [phone]   Fax:  [phone]  Physical Therapy Treatment  Patient Details  Name: Ricky Holden MRN: [id number] Date of Birth: 27-Oct-1932 Referring Provider:  Christain Sacramento, MD  Encounter Date: 02/10/2015      PT End of Session - 02/10/15 0951    Visit Number 3   Number of Visits 12   Date for PT Re-Evaluation 02/28/15   PT Start Time 0945   PT Stop Time 1033   PT Time Calculation (min) 48 min      Past Medical History  Diagnosis Date  . Cancer     Prostate cancer - radiation  . Hypertension   . High cholesterol   . Arthritis   . Bell's palsy   . Cataract     has had surgery  . Diabetes mellitus without complication     type 2  . Chronic kidney disease     Cr 1.40 - 2.0 from ~ 2010 - 2015    Past Surgical History  Procedure Laterality Date  . Eye surgery Left     cataract surgery with lens implant  . Tonsillectomy    . Tympanoplasty Left     wears hearing aids  . Colonoscopy    . Anterior cervical decomp/discectomy fusion N/A 01/24/2014    Procedure: ANTERIOR CERVICAL DECOMPRESSION/DISCECTOMY FUSION 3 LEVELS Cervical three/four,four/five, five/six  anterior cervical decompression with fusion interbody prosthesis plating and bonegraft;  Surgeon: Newman Pies, MD;  Location: [facility];  Service: Neurosurgery;  Laterality: N/A;    There were no vitals filed for this visit.  Visit Diagnosis:  Right shoulder pain  Shoulder stiffness, right      Subjective Assessment - 02/10/15 0953    Subjective right shoulder started to hurt approximately one year ago. Having surgery 03-01-2015   Patient Stated Goals Use right UE without pain.   Pain Score 6    Pain Location Shoulder   Pain Orientation Right   Pain Descriptors / Indicators Aching   Pain Type Chronic pain   Pain Onset More than a month ago   Pain Frequency Constant   Aggravating Factors   lifting RT UE   Pain Relieving Factors rest            [facility] PT Assessment - 02/10/15 0001    ROM / Strength   AROM / PROM / Strength PROM   PROM   Overall PROM  Deficits   Overall PROM Comments FLEXION 130 degrees, ER 65 degrees                     [facility] Adult PT Treatment/Exercise - 02/10/15 0001    Exercises   Exercises Shoulder   Shoulder Exercises: Supine   Flexion AAROM   Other Supine Exercises supine cane press and elevation 3x 10 each   Shoulder Exercises: Seated   Other Seated Exercises UE ranger seated x 69mins Flexion/Ext, and circles each way   Manual Therapy   Manual Therapy Passive ROM   Passive ROM PROM to RT shldr for IR, ER, and elevation with low load end-range holds. Shldr blade mobs and inferior GHJ glides. Rhythmic stabs for ER/IR in scaption                  PT Short Term Goals - 02/10/15 1018    PT SHORT TERM GOAL #1   Title Independent with an HEP.   Time 2  Period Weeks   Status Achieved           PT Long Term Goals - 02/10/15 1018    PT LONG TERM GOAL #1   Title Active right shoulder flexion to 145 degrees so the patient can easily reach overhead   Time 4   Period Weeks   Status On-going   PT LONG TERM GOAL #2   Title Active ER to 70 degrees+ to allow for easily donning/doffing of apparel   Time 4   Period Weeks   Status On-going   PT LONG TERM GOAL #3   Title Increase ROM so patient is able to reach behind back to L3 with right hand.   Time 4   Period Weeks   Status On-going   PT LONG TERM GOAL #4   Title Increase shoulder strength to a solid 4+/5 to increase stability for performance of functional activities   Time 4   Period Weeks   Status On-going   PT LONG TERM GOAL #5   Title Perform ADL's with pain not > 3/10 and no referred pain.   Period Weeks   Status On-going               Plan - 02/10/15 1020    Clinical Impression Statement Pt did fairly well with manual techniques and exs  today. He was able to meet STG for HEP, but not any LTGs due to ROM deficits and weakness. He did have increased ROM in flexion and ER. goals are ongoing   Pt will benefit from skilled therapeutic intervention in order to improve on the following deficits Pain;Decreased strength;Decreased range of motion;Decreased activity tolerance   Rehab Potential Good   PT Frequency 3x / week   PT Duration 4 weeks   PT Treatment/Interventions Electrical Stimulation;Ultrasound;Therapeutic exercise;Manual techniques;Patient/family education   PT Next Visit Plan 1-1 right shoulder PROM and capsular stretching.  AAROM; UE Ranger   Consulted and Agree with Plan of Care Patient        Problem List Patient Active Problem List   Diagnosis Date Noted  . Spondylolisthesis of lumbar region 06/30/2014  . Cervical spondylosis with myelopathy 01/24/2014    RAMSEUR,CHRIS, PTA 02/10/2015, 10:47 AM  [facility] [address] [place], [place], [address] Phone: [phone]   Fax:  [phone]

## 2015-02-10 NOTE — Patient Instructions (Signed)
ROM: External / Internal Rotation - Wand   Holding wand with left hand palm up, push out from body with other hand, palm down. Keep both elbows bent. When stretch is felt, hold _5___ seconds. Repeat to other side, leading with same hand. Keep elbows bent. Repeat __10__ times per set. Do __2-3__ sets per session. Do __2__ sessions per day.  http://orth.exer.us/748   Copyright  VHI. All rights reserved.  ROM: Flexion - Wand (Supine)   Lie on back holding wand. Raise arms over head.  Repeat __10__ times per set. Do _2-3___ sets per session. Do _2___ sessions per day.  http://orth.exer.us/928   Copyright  VHI. All rights reserved.   ex

## 2015-02-13 ENCOUNTER — Ambulatory Visit: Payer: Medicare Other | Admitting: Physical Therapy

## 2015-02-13 ENCOUNTER — Encounter: Payer: Self-pay | Admitting: Physical Therapy

## 2015-02-13 DIAGNOSIS — M25511 Pain in right shoulder: Secondary | ICD-10-CM | POA: Diagnosis not present

## 2015-02-13 DIAGNOSIS — M25611 Stiffness of right shoulder, not elsewhere classified: Secondary | ICD-10-CM

## 2015-02-13 NOTE — Therapy (Signed)
[facility] [place], [place], [address] Phone: [phone]   Fax:  [phone]  Physical Therapy Treatment  Patient Details  Name: Ricky Holden MRN: [id number] Date of Birth: 1933/05/08 Referring Provider:  Christain Sacramento, MD  Encounter Date: 02/13/2015      PT End of Session - 02/13/15 0934    Visit Number 4   Number of Visits 12   Date for PT Re-Evaluation 02/28/15   PT Start Time 0900   PT Stop Time 0949   PT Time Calculation (min) 49 min   Activity Tolerance Patient tolerated treatment well   Behavior During Therapy [facility] for tasks assessed/performed      Past Medical History  Diagnosis Date  . Cancer     Prostate cancer - radiation  . Hypertension   . High cholesterol   . Arthritis   . Bell's palsy   . Cataract     has had surgery  . Diabetes mellitus without complication     type 2  . Chronic kidney disease     Cr 1.40 - 2.0 from ~ 2010 - 2015    Past Surgical History  Procedure Laterality Date  . Eye surgery Left     cataract surgery with lens implant  . Tonsillectomy    . Tympanoplasty Left     wears hearing aids  . Colonoscopy    . Anterior cervical decomp/discectomy fusion N/A 01/24/2014    Procedure: ANTERIOR CERVICAL DECOMPRESSION/DISCECTOMY FUSION 3 LEVELS Cervical three/four,four/five, five/six  anterior cervical decompression with fusion interbody prosthesis plating and bonegraft;  Surgeon: Newman Pies, MD;  Location: [facility];  Service: Neurosurgery;  Laterality: N/A;    There were no vitals filed for this visit.  Visit Diagnosis:  Right shoulder pain  Shoulder stiffness, right      Subjective Assessment - 02/13/15 0902    Subjective right shoulder started to hurt approximately one year ago. Having surgery 03-01-2015, very sore yesterday some better today per patient   Patient Stated Goals Use right UE without pain.   Currently in Pain? Yes   Pain Score 6    Pain Location  Shoulder   Pain Orientation Right   Pain Descriptors / Indicators Aching   Pain Type Chronic pain   Pain Onset More than a month ago   Pain Frequency Constant   Aggravating Factors  moving shoulder   Pain Relieving Factors rest            [facility] PT Assessment - 02/13/15 0001    ROM / Strength   AROM / PROM / Strength AROM;PROM   AROM   Overall AROM  Deficits   AROM Assessment Site Shoulder   Right/Left Shoulder Right   Right Shoulder Flexion 110 Degrees   Right Shoulder External Rotation 60 Degrees   PROM   Overall PROM  Deficits   PROM Assessment Site Shoulder   Right/Left Shoulder Right   Right Shoulder Flexion 135 Degrees   Right Shoulder External Rotation 75 Degrees                     [facility] Adult PT Treatment/Exercise - 02/13/15 0001    Shoulder Exercises: Supine   Other Supine Exercises supine cane press and elevation 3x 10 each   Shoulder Exercises: Seated   Other Seated Exercises UE ranger seated x 24mins Flexion/Ext, and circles each way   Other Seated Exercises scaption 0# 2x10   Shoulder Exercises: ROM/Strengthening  Other ROM/Strengthening Exercises IR/ER with yellow tband half range 2x10   Modalities   Modalities Electrical Stimulation   Electrical Stimulation   Electrical Stimulation Location right shoulder   Electrical Stimulation Action premod   Electrical Stimulation Parameters 80-150hz    Electrical Stimulation Goals Pain   Manual Therapy   Manual Therapy Passive ROM   Passive ROM PROM to RT shldr for IR, ER, and elevation with low load end-range holds/Rhythmic stabs for ER/IR in scaption then flex/ext at 90 degrees                  PT Short Term Goals - 02/10/15 1018    PT SHORT TERM GOAL #1   Title Independent with an HEP.   Time 2   Period Weeks   Status Achieved           PT Long Term Goals - 02/10/15 1018    PT LONG TERM GOAL #1   Title Active right shoulder flexion to 145 degrees so the patient can easily  reach overhead   Time 4   Period Weeks   Status On-going   PT LONG TERM GOAL #2   Title Active ER to 70 degrees+ to allow for easily donning/doffing of apparel   Time 4   Period Weeks   Status On-going   PT LONG TERM GOAL #3   Title Increase ROM so patient is able to reach behind back to L3 with right hand.   Time 4   Period Weeks   Status On-going   PT LONG TERM GOAL #4   Title Increase shoulder strength to a solid 4+/5 to increase stability for performance of functional activities   Time 4   Period Weeks   Status On-going   PT LONG TERM GOAL #5   Title Perform ADL's with pain not > 3/10 and no referred pain.   Period Weeks   Status On-going               Plan - 02/13/15 0935    Clinical Impression Statement Patient tolersted treatment well with no reports of pain increase and improved ROM today. Initiated seated strengthening today with no difficulty. Patient does not use arm a lot at home. Goals ongoing due to pain, ROM, and strength deficits.   Pt will benefit from skilled therapeutic intervention in order to improve on the following deficits Pain;Decreased strength;Decreased range of motion;Decreased activity tolerance   Rehab Potential Good   PT Frequency 3x / week   PT Duration 4 weeks   PT Treatment/Interventions Electrical Stimulation;Ultrasound;Therapeutic exercise;Manual techniques;Patient/family education   PT Next Visit Plan cont with POC   Consulted and Agree with Plan of Care Patient        Problem List Patient Active Problem List   Diagnosis Date Noted  . Spondylolisthesis of lumbar region 06/30/2014  . Cervical spondylosis with myelopathy 01/24/2014    Roberto Hlavaty P, PTA 02/13/2015, 10:00 AM  [facility] [place], [place], [address] Phone: [phone]   Fax:  [phone]

## 2015-02-17 ENCOUNTER — Ambulatory Visit: Payer: Medicare Other | Admitting: *Deleted

## 2015-02-17 ENCOUNTER — Encounter: Payer: Self-pay | Admitting: *Deleted

## 2015-02-17 DIAGNOSIS — M25611 Stiffness of right shoulder, not elsewhere classified: Secondary | ICD-10-CM

## 2015-02-17 DIAGNOSIS — M25511 Pain in right shoulder: Secondary | ICD-10-CM | POA: Diagnosis not present

## 2015-02-17 NOTE — Therapy (Signed)
[facility] [place], [place], [address] Phone: [phone]   Fax:  [phone]  Physical Therapy Treatment  Patient Details  Name: Ricky Holden MRN: [id number] Date of Birth: Apr 26, 1933 Referring Provider:  Christain Sacramento, MD  Encounter Date: 02/17/2015      PT End of Session - 02/17/15 0907    Visit Number 5   Number of Visits 12   Date for PT Re-Evaluation 02/28/15   PT Start Time 0900   PT Stop Time 0939   PT Time Calculation (min) 39 min      Past Medical History  Diagnosis Date  . Cancer     Prostate cancer - radiation  . Hypertension   . High cholesterol   . Arthritis   . Bell's palsy   . Cataract     has had surgery  . Diabetes mellitus without complication     type 2  . Chronic kidney disease     Cr 1.40 - 2.0 from ~ 2010 - 2015    Past Surgical History  Procedure Laterality Date  . Eye surgery Left     cataract surgery with lens implant  . Tonsillectomy    . Tympanoplasty Left     wears hearing aids  . Colonoscopy    . Anterior cervical decomp/discectomy fusion N/A 01/24/2014    Procedure: ANTERIOR CERVICAL DECOMPRESSION/DISCECTOMY FUSION 3 LEVELS Cervical three/four,four/five, five/six  anterior cervical decompression with fusion interbody prosthesis plating and bonegraft;  Surgeon: Newman Pies, MD;  Location: [place] [facility];  Service: Neurosurgery;  Laterality: N/A;    There were no vitals filed for this visit.  Visit Diagnosis:  Right shoulder pain  Shoulder stiffness, right      Subjective Assessment - 02/17/15 0905    Subjective right shoulder started to hurt approximately one year ago. Having surgery 03-01-2015, very sore yesterday some better today per patient   Patient Stated Goals Use right UE without pain.   Currently in Pain? Yes   Pain Orientation Right   Pain Descriptors / Indicators Aching   Pain Type Chronic pain   Pain Onset More than a month ago   Pain Frequency  Constant   Aggravating Factors  moving shldr   Pain Relieving Factors rest            [facility] PT Assessment - 02/17/15 0001    AROM   Overall AROM  Deficits   Overall AROM Comments Right shoulder flexion= 112 degrees; ER= 70 degrees and IR= 55 degrees.                     [facility] Adult PT Treatment/Exercise - 02/17/15 0001    Exercises   Exercises Shoulder   Shoulder Exercises: Supine   Other Supine Exercises supine cane press and elevation 3x 10 each   Shoulder Exercises: Seated   Other Seated Exercises UE ranger seated x 20mns Flexion/Ext, and circles each way   Manual Therapy   Manual Therapy Passive ROM   Passive ROM PROM to RT shldr for IR, ER, and elevation with low load end-range holds/Rhythmic stabs for ER/IR in scaption then flex/ext at 90 degrees                PT Education - 02/17/15 0954    Education provided Yes   Education Details isometrics   Person(s) Educated Patient   Methods Explanation;Demonstration;Tactile cues;Verbal cues;Handout   Comprehension Verbalized understanding;Returned demonstration  PT Short Term Goals - 03-08-15 0914    PT SHORT TERM GOAL #1   Title Independent with an HEP.   Time 2   Period Weeks   Status Achieved           PT Long Term Goals - 03-08-2015 0914    PT LONG TERM GOAL #1   Title Active right shoulder flexion to 145 degrees so the patient can easily reach overhead   Time 4   Period Weeks   Status Not Met   PT LONG TERM GOAL #2   Title Active ER to 70 degrees+ to allow for easily donning/doffing of apparel   Time 4   Status Achieved   PT LONG TERM GOAL #3   Title Increase ROM so patient is able to reach behind back to L3 with right hand.   Time 4   Period Weeks   Status Not Met   PT LONG TERM GOAL #4   Title Increase shoulder strength to a solid 4+/5 to increase stability for performance of functional activities   Time 4   Period Weeks   Status Not Met                Plan - 03-08-2015 0913    Clinical Impression Statement Pt did well with Rx and is independent with HEP that he will continue with until surgery 03-01-15. He was able to meet one LTG. Others NM due to Pain and decreased ROM   Pt will benefit from skilled therapeutic intervention in order to improve on the following deficits Pain;Decreased strength;Decreased range of motion;Decreased activity tolerance   Rehab Potential Good   PT Frequency 3x / week   PT Duration 4 weeks   PT Treatment/Interventions Electrical Stimulation;Ultrasound;Therapeutic exercise;Manual techniques;Patient/family education   PT Next Visit Plan DC to HEP as per Pt. until Sx on 03-01-15   Consulted and Agree with Plan of Care Patient          G-Codes - Mar 08, 2015 1235    Functional Assessment Tool Used 5th visit FOTO41 % limited      Problem List Patient Active Problem List   Diagnosis Date Noted  . Spondylolisthesis of lumbar region 06/30/2014  . Cervical spondylosis with myelopathy 01/24/2014    Sumedha Munnerlyn,CHRIS, PTA 2015/03/08, 12:36 PM  [facility] [address] [place], [place], [address] Phone: [phone]   Fax:  [phone]

## 2015-02-18 NOTE — Therapy (Signed)
[facility] [place], [place], [address] Phone: [phone]   Fax:  [phone]  Physical Therapy Treatment  Patient Details  Name: Ricky Holden MRN: [id number] Date of Birth: 1932/07/03 Referring Provider:  Christain Sacramento, MD  Encounter Date: 02/17/2015      PT End of Session - 02/17/15 0907    Visit Number 5   Number of Visits 12   Date for PT Re-Evaluation 02/28/15   PT Start Time 0900   PT Stop Time 0939   PT Time Calculation (min) 39 min      Past Medical History  Diagnosis Date  . Cancer     Prostate cancer - radiation  . Hypertension   . High cholesterol   . Arthritis   . Bell's palsy   . Cataract     has had surgery  . Diabetes mellitus without complication     type 2  . Chronic kidney disease     Cr 1.40 - 2.0 from ~ 2010 - 2015    Past Surgical History  Procedure Laterality Date  . Eye surgery Left     cataract surgery with lens implant  . Tonsillectomy    . Tympanoplasty Left     wears hearing aids  . Colonoscopy    . Anterior cervical decomp/discectomy fusion N/A 01/24/2014    Procedure: ANTERIOR CERVICAL DECOMPRESSION/DISCECTOMY FUSION 3 LEVELS Cervical three/four,four/five, five/six  anterior cervical decompression with fusion interbody prosthesis plating and bonegraft;  Surgeon: Newman Pies, MD;  Location: [facility];  Service: Neurosurgery;  Laterality: N/A;    There were no vitals filed for this visit.  Visit Diagnosis:  Right shoulder pain  Shoulder stiffness, right      Subjective Assessment - 02/17/15 0905    Subjective right shoulder started to hurt approximately one year ago. Having surgery 03-01-2015, very sore yesterday some better today per patient   Patient Stated Goals Use right UE without pain.   Currently in Pain? Yes   Pain Orientation Right   Pain Descriptors / Indicators Aching   Pain Type Chronic pain   Pain Onset More than a month ago   Pain Frequency  Constant   Aggravating Factors  moving shldr   Pain Relieving Factors rest                                 PT Education - 02/17/15 0954    Education provided Yes   Education Details isometrics   Person(s) Educated Patient   Methods Explanation;Demonstration;Tactile cues;Verbal cues;Handout   Comprehension Verbalized understanding;Returned demonstration          PT Short Term Goals - 02/17/15 0914    PT SHORT TERM GOAL #1   Title Independent with an HEP.   Time 2   Period Weeks   Status Achieved           PT Long Term Goals - 02/17/15 0914    PT LONG TERM GOAL #1   Title Active right shoulder flexion to 145 degrees so the patient can easily reach overhead   Time 4   Period Weeks   Status Not Met   PT LONG TERM GOAL #2   Title Active ER to 70 degrees+ to allow for easily donning/doffing of apparel   Time 4   Status Achieved   PT LONG TERM GOAL #3   Title Increase ROM so patient is able to  reach behind back to L3 with right hand.   Time 4   Period Weeks   Status Not Met   PT LONG TERM GOAL #4   Title Increase shoulder strength to a solid 4+/5 to increase stability for performance of functional activities   Time 4   Period Weeks   Status Not Met               Plan - 02-21-15 0913    Clinical Impression Statement Pt did well with Rx and is independent with HEP that he will continue with until surgery 03-01-15. He was able to meet one LTG. Others NM due to Pain and decreased ROM   Pt will benefit from skilled therapeutic intervention in order to improve on the following deficits Pain;Decreased strength;Decreased range of motion;Decreased activity tolerance   Rehab Potential Good   PT Frequency 3x / week   PT Duration 4 weeks   PT Treatment/Interventions Electrical Stimulation;Ultrasound;Therapeutic exercise;Manual techniques;Patient/family education   PT Next Visit Plan DC to HEP as per Pt. until Sx on 03-01-15   Consulted and Agree  with Plan of Care Patient          G-Codes - 2015/02/21 1235    Functional Assessment Tool Used 5th visit FOTO41 % limited   Functional Limitation Mobility: Walking and moving around   Mobility: Walking and Moving Around Current Status [phone]) At least 40 percent but less than 60 percent impaired, limited or restricted   Mobility: Walking and Moving Around Goal Status [phone]) At least 1 percent but less than 20 percent impaired, limited or restricted   Mobility: Walking and Moving Around Discharge Status [phone]) At least 40 percent but less than 60 percent impaired, limited or restricted      Problem List Patient Active Problem List   Diagnosis Date Noted  . Spondylolisthesis of lumbar region 06/30/2014  . Cervical spondylosis with myelopathy 01/24/2014   PHYSICAL THERAPY DISCHARGE SUMMARY  Visits from Start of Care: 5  Current functional level related to goals / functional outcomes: Refer to above.   Remaining deficits: Refer to goal section.   Education / Equipment: HEP.  Plan: Patient agrees to discharge.  Patient goals were partially met. Patient is being discharged due to meeting the stated rehab goals.  ?????       Daiana Vitiello, [place]  MPT  02/18/2015, 10:59 AM  [facility] [address] [place], [place], [address] Phone: [phone]   Fax:  [phone]

## 2015-03-09 ENCOUNTER — Ambulatory Visit: Payer: Medicare Other | Attending: Orthopedic Surgery | Admitting: Physical Therapy

## 2015-03-09 DIAGNOSIS — M25611 Stiffness of right shoulder, not elsewhere classified: Secondary | ICD-10-CM | POA: Insufficient documentation

## 2015-03-09 DIAGNOSIS — M25511 Pain in right shoulder: Secondary | ICD-10-CM

## 2015-03-09 NOTE — Therapy (Signed)
[facility] [place], [place], [address] Phone: [phone]   Fax:  [phone]  Physical Therapy Evaluation  Patient Details  Name: Ricky Holden MRN: [id number] Date of Birth: June 02, 1933 Referring Provider:  Christain Sacramento, MD  Encounter Date: 03/09/2015      PT End of Session - 03/09/15 1539    Visit Number 1   Number of Visits 12   Date for PT Re-Evaluation 02/28/15   PT Start Time 1200   PT Stop Time 1309   PT Time Calculation (min) 69 min   Activity Tolerance Patient tolerated treatment well   Behavior During Therapy [facility] for tasks assessed/performed      Past Medical History  Diagnosis Date  . Cancer     Prostate cancer - radiation  . Hypertension   . High cholesterol   . Arthritis   . Bell's palsy   . Cataract     has had surgery  . Diabetes mellitus without complication     type 2  . Chronic kidney disease     Cr 1.40 - 2.0 from ~ 2010 - 2015    Past Surgical History  Procedure Laterality Date  . Eye surgery Left     cataract surgery with lens implant  . Tonsillectomy    . Tympanoplasty Left     wears hearing aids  . Colonoscopy    . Anterior cervical decomp/discectomy fusion N/A 01/24/2014    Procedure: ANTERIOR CERVICAL DECOMPRESSION/DISCECTOMY FUSION 3 LEVELS Cervical three/four,four/five, five/six  anterior cervical decompression with fusion interbody prosthesis plating and bonegraft;  Surgeon: Newman Pies, MD;  Location: [employer] [facility];  Service: Neurosurgery;  Laterality: N/A;    There were no vitals filed for this visit.  Visit Diagnosis:  Shoulder stiffness, right - Plan: PT plan of care cert/re-cert  Right shoulder pain - Plan: PT plan of care cert/re-cert      Subjective Assessment - 03/09/15 1322    Subjective Been doing the pendulum exercises at home several times a day since surgery.  I now have swelling at my right elbow.   Patient Stated Goals Use right UE without pain.    Currently in Pain? Yes   Pain Score 4    Pain Location Shoulder   Pain Orientation Right   Pain Type Surgical pain   Pain Onset 1 to 4 weeks ago   Pain Frequency Constant            [facility] PT Assessment - 03/09/15 0001    Assessment   Medical Diagnosis s/p right shoulder scope.   Onset Date/Surgical Date --  03/01/15(surgery date).   Precautions   Precautions --  Begin with gentle right shoulder PROM in supine.   Restrictions   Weight Bearing Restrictions No   Balance Screen   Has the patient fallen in the past 6 months No   Has the patient had a decrease in activity level because of a fear of falling?  Yes   Is the patient reluctant to leave their home because of a fear of falling?  No   Home Environment   Living Environment Private residence   Prior Function   Level of Independence Independent   Observation/Other Assessments   Observations Swollen right olecranon bursa.   ROM / Strength   AROM / PROM / Strength PROM   PROM   Overall PROM Comments Rigth shoulder flexion= 80 degrees and ER= 22 degrees and IR to abdomen.   Palpation  Palpation comment /o difuse right shoulder pain currently.   Ambulation/Gait   Gait Comments Patient using a walker for extra safety and stability.                   [place] Adult PT Treatment/Exercise - Mar 26, 2015 0001    Modalities   Modalities Vasopneumatic   Vasopneumatic   Number Minutes Vasopneumatic  15 minutes   Vasopnuematic Location  --  Right shoulder.   Vasopneumatic Pressure Medium   Manual Therapy   Manual Therapy Passive ROM   Passive ROM Right shoulder PROM x 23 minutes--low load long duration into flexion nd ER.                  PT Short Term Goals - 02/17/15 0914    PT SHORT TERM GOAL #1   Title Independent with an HEP.   Time 2   Period Weeks   Status Achieved           PT Long Term Goals - 03/26/15 1542    PT LONG TERM GOAL #1   Title Active right shoulder flexion to 145 degrees  so the patient can easily reach overhead   Time 4   Status New   PT LONG TERM GOAL #2   Title Active ER to 70 degrees+ to allow for easily donning/doffing of apparel   Time 4   Period Weeks   Status New   PT LONG TERM GOAL #3   Title Increase ROM so patient is able to reach behind back to L3 with right hand.   Time 4   Period Weeks   Status New   PT LONG TERM GOAL #4   Title Increase shoulder strength to a solid 4+/5 to increase stability for performance of functional activities   Time 4   Period Weeks   Status New   PT LONG TERM GOAL #5   Title Perform ADL's with pain not > 3/10.   Time 4   Period Weeks   Status New   Additional Long Term Goals   Additional Long Term Goals Yes   PT LONG TERM GOAL #6   Title Ind with HEP.   Time 4   Period Weeks   Status New               Plan - 03/26/15 1539    Clinical Impression Statement The patient underwent a right shoulder arthroscopic surgery on 03/01/15.  He has been doing the pendulum exercise several times a day since surgery.  He is not using a sling.   Pt will benefit from skilled therapeutic intervention in order to improve on the following deficits Pain;Decreased activity tolerance;Decreased range of motion   Rehab Potential Good   PT Frequency 3x / week   PT Duration 4 weeks   PT Treatment/Interventions ADLs/Self Care Home Management;Electrical Stimulation;Cryotherapy;Ultrasound;Therapeutic activities;Therapeutic exercise;Manual techniques;Patient/family education;Neuromuscular re-education;Vasopneumatic Device   PT Next Visit Plan Begin with right shoulder PROM.  Vasopneumatic to patient's right shoulder.          G-Codes - 03/26/15 1545    Functional Assessment Tool Used Clinical judgement.   Functional Limitation Mobility: Walking and moving around   Mobility: Walking and Moving Around Current Status [phone]) At least 80 percent but less than 100 percent impaired, limited or restricted   Mobility: Walking and  Moving Around Goal Status (E0712) At least 20 percent but less than 40 percent impaired, limited or restricted       Problem List  Patient Active Problem List   Diagnosis Date Noted  . Spondylolisthesis of lumbar region 06/30/2014  . Cervical spondylosis with myelopathy 01/24/2014    APPLEGATE, [place] MPT 03/09/2015, 3:46 PM  [facility] [place], [place], [address] Phone: [phone]   Fax:  [phone]

## 2015-03-13 ENCOUNTER — Ambulatory Visit: Payer: Medicare Other | Admitting: Physical Therapy

## 2015-03-13 ENCOUNTER — Encounter: Payer: Self-pay | Admitting: Physical Therapy

## 2015-03-13 DIAGNOSIS — M25611 Stiffness of right shoulder, not elsewhere classified: Secondary | ICD-10-CM | POA: Diagnosis not present

## 2015-03-13 DIAGNOSIS — M25511 Pain in right shoulder: Secondary | ICD-10-CM

## 2015-03-13 NOTE — Therapy (Signed)
[facility] [place], [place], [address] Phone: [phone]   Fax:  [phone]  Physical Therapy Treatment  Patient Details  Name: Ricky Holden MRN: [id number] Date of Birth: 1933/05/17 Referring Provider:  Christain Sacramento, MD  Encounter Date: 03/13/2015      PT End of Session - 03/13/15 1500    Visit Number 2   Number of Visits 12   Date for PT Re-Evaluation 02/28/15   PT Start Time 1544   PT Stop Time 1625   PT Time Calculation (min) 41 min   Activity Tolerance Patient tolerated treatment well   Behavior During Therapy [facility] for tasks assessed/performed      Past Medical History  Diagnosis Date  . Cancer     Prostate cancer - radiation  . Hypertension   . High cholesterol   . Arthritis   . Bell's palsy   . Cataract     has had surgery  . Diabetes mellitus without complication     type 2  . Chronic kidney disease     Cr 1.40 - 2.0 from ~ 2010 - 2015    Past Surgical History  Procedure Laterality Date  . Eye surgery Left     cataract surgery with lens implant  . Tonsillectomy    . Tympanoplasty Left     wears hearing aids  . Colonoscopy    . Anterior cervical decomp/discectomy fusion N/A 01/24/2014    Procedure: ANTERIOR CERVICAL DECOMPRESSION/DISCECTOMY FUSION 3 LEVELS Cervical three/four,four/five, five/six  anterior cervical decompression with fusion interbody prosthesis plating and bonegraft;  Surgeon: Newman Pies, MD;  Location: [place] [facility];  Service: Neurosurgery;  Laterality: N/A;    There were no vitals filed for this visit.  Visit Diagnosis:  Shoulder stiffness, right  Right shoulder pain      Subjective Assessment - 03/13/15 1455    Subjective shoulder sore today per patient   Patient Stated Goals Use right UE without pain.   Currently in Pain? Yes   Pain Score 7    Pain Location Shoulder   Pain Orientation Right   Pain Descriptors / Indicators Aching   Pain Type Surgical pain    Pain Onset 1 to 4 weeks ago   Pain Frequency Constant   Aggravating Factors  movement   Pain Relieving Factors rest            [facility] PT Assessment - 03/13/15 0001    PROM   PROM Assessment Site Shoulder   Right/Left Shoulder Right   Right Shoulder Flexion 110 Degrees   Right Shoulder External Rotation 60 Degrees                     [facility] Adult PT Treatment/Exercise - 03/13/15 0001    Vasopneumatic   Number Minutes Vasopneumatic  15 minutes   Vasopnuematic Location  Shoulder   Vasopneumatic Pressure Medium   Manual Therapy   Manual Therapy Passive ROM   Passive ROM PROM Right shoulder low load long duration into flexion and ER.                  PT Short Term Goals - 02/17/15 0914    PT SHORT TERM GOAL #1   Title Independent with an HEP.   Time 2   Period Weeks   Status Achieved           PT Long Term Goals - 03/09/15 1542    PT LONG TERM GOAL #1  Title Active right shoulder flexion to 145 degrees so the patient can easily reach overhead   Time 4   Status New   PT LONG TERM GOAL #2   Title Active ER to 70 degrees+ to allow for easily donning/doffing of apparel   Time 4   Period Weeks   Status New   PT LONG TERM GOAL #3   Title Increase ROM so patient is able to reach behind back to L3 with right hand.   Time 4   Period Weeks   Status New   PT LONG TERM GOAL #4   Title Increase shoulder strength to a solid 4+/5 to increase stability for performance of functional activities   Time 4   Period Weeks   Status New   PT LONG TERM GOAL #5   Title Perform ADL's with pain not > 3/10.   Time 4   Period Weeks   Status New   Additional Long Term Goals   Additional Long Term Goals Yes   PT LONG TERM GOAL #6   Title Ind with HEP.   Time 4   Period Weeks   Status New               Plan - 03/13/15 1502    Clinical Impression Statement Patient progressing with PROM today. Has improved with range yet still has increased pain  per patient. Goals ongoing due to pain and healing limitations.   Pt will benefit from skilled therapeutic intervention in order to improve on the following deficits Pain;Decreased activity tolerance;Decreased range of motion   Rehab Potential Good   PT Frequency 3x / week   PT Duration 4 weeks   PT Next Visit Plan Cont with POC per MPT PROM   Consulted and Agree with Plan of Care Patient        Problem List Patient Active Problem List   Diagnosis Date Noted  . Spondylolisthesis of lumbar region 06/30/2014  . Cervical spondylosis with myelopathy 01/24/2014    DUNFORD, CHRISTINA P, PTA 03/13/2015, 3:26 PM  [facility] [address] [place], [place], [address] Phone: [phone]   Fax:  [phone]

## 2015-03-15 ENCOUNTER — Ambulatory Visit: Payer: Medicare Other | Admitting: Physical Therapy

## 2015-03-15 ENCOUNTER — Encounter: Payer: Self-pay | Admitting: Physical Therapy

## 2015-03-15 DIAGNOSIS — M25611 Stiffness of right shoulder, not elsewhere classified: Secondary | ICD-10-CM

## 2015-03-15 DIAGNOSIS — M25511 Pain in right shoulder: Secondary | ICD-10-CM

## 2015-03-15 NOTE — Therapy (Signed)
[facility] [place], [place], [address] Phone: [phone]   Fax:  [phone]  Physical Therapy Treatment  Patient Details  Name: Ricky Holden MRN: [id number] Date of Birth: [birth year] Referring Provider:  Christain Sacramento, MD  Encounter Date: 03/15/2015      PT End of Session - 03/15/15 1459    Visit Number 3   Number of Visits 12   Date for PT Re-Evaluation 02/28/15   PT Start Time 3818   PT Stop Time 1526   PT Time Calculation (min) 44 min   Activity Tolerance Patient tolerated treatment well   Behavior During Therapy [facility] for tasks assessed/performed      Past Medical History  Diagnosis Date  . Cancer     Prostate cancer - radiation  . Hypertension   . High cholesterol   . Arthritis   . Bell's palsy   . Cataract     has had surgery  . Diabetes mellitus without complication     type 2  . Chronic kidney disease     Cr 1.40 - 2.0 from ~ 2010 - 2015    Past Surgical History  Procedure Laterality Date  . Eye surgery Left     cataract surgery with lens implant  . Tonsillectomy    . Tympanoplasty Left     wears hearing aids  . Colonoscopy    . Anterior cervical decomp/discectomy fusion N/A 01/24/2014    Procedure: ANTERIOR CERVICAL DECOMPRESSION/DISCECTOMY FUSION 3 LEVELS Cervical three/four,four/five, five/six  anterior cervical decompression with fusion interbody prosthesis plating and bonegraft;  Surgeon: Newman Pies, MD;  Location: [place] [facility];  Service: Neurosurgery;  Laterality: N/A;    There were no vitals filed for this visit.  Visit Diagnosis:  Shoulder stiffness, right  Right shoulder pain      Subjective Assessment - 03/15/15 1451    Subjective shoulder sore today per patient   Patient Stated Goals Use right UE without pain.   Currently in Pain? Yes   Pain Score 7    Pain Location Shoulder   Pain Orientation Right   Pain Descriptors / Indicators Aching   Pain Type Surgical pain    Pain Onset 1 to 4 weeks ago   Pain Frequency Constant   Aggravating Factors  movement   Pain Relieving Factors rest            [facility] PT Assessment - 03/15/15 0001    PROM   PROM Assessment Site Shoulder   Right/Left Shoulder Right   Right Shoulder Flexion 122 Degrees   Right Shoulder External Rotation 70 Degrees                     [facility] Adult PT Treatment/Exercise - 03/15/15 0001    Shoulder Exercises: Supine   Other Supine Exercises supine cane press and elevation 3x 10 each   Shoulder Exercises: Pulleys   Flexion --  21min   Vasopneumatic   Number Minutes Vasopneumatic  15 minutes   Vasopnuematic Location  Shoulder   Vasopneumatic Pressure Medium   Manual Therapy   Manual Therapy Passive ROM   Passive ROM PROM Right shoulder low load long duration into flexion and ER.   Prosthetics   Prosthetic Care Comments                     PT Short Term Goals - 02/17/15 0914    PT SHORT TERM GOAL #1   Title Independent  with an HEP.   Time 2   Period Weeks   Status Achieved           PT Long Term Goals - 03/09/15 1542    PT LONG TERM GOAL #1   Title Active right shoulder flexion to 145 degrees so the patient can easily reach overhead   Time 4   Status New   PT LONG TERM GOAL #2   Title Active ER to 70 degrees+ to allow for easily donning/doffing of apparel   Time 4   Period Weeks   Status New   PT LONG TERM GOAL #3   Title Increase ROM so patient is able to reach behind back to L3 with right hand.   Time 4   Period Weeks   Status New   PT LONG TERM GOAL #4   Title Increase shoulder strength to a solid 4+/5 to increase stability for performance of functional activities   Time 4   Period Weeks   Status New   PT LONG TERM GOAL #5   Title Perform ADL's with pain not > 3/10.   Time 4   Period Weeks   Status New   Additional Long Term Goals   Additional Long Term Goals Yes   PT LONG TERM GOAL #6   Title Ind with HEP.   Time 4    Period Weeks   Status New               Plan - 03/15/15 1504    Clinical Impression Statement Patient progressing with all activities. Progressed patient with AAROM per MPT today with no difficulty per patient. Patient has improved P/AAROM today. Goals ongong due to pain, strength and ROM limitations.    Pt will benefit from skilled therapeutic intervention in order to improve on the following deficits Pain;Decreased activity tolerance;Decreased range of motion   Rehab Potential Good   PT Frequency 3x / week   PT Duration 4 weeks   PT Treatment/Interventions ADLs/Self Care Home Management;Electrical Stimulation;Cryotherapy;Ultrasound;Therapeutic activities;Therapeutic exercise;Manual techniques;Patient/family education;Neuromuscular re-education;Vasopneumatic Device   PT Next Visit Plan Cont with POC per MPT AAROM   Consulted and Agree with Plan of Care Patient        Problem List Patient Active Problem List   Diagnosis Date Noted  . Spondylolisthesis of lumbar region 06/30/2014  . Cervical spondylosis with myelopathy 01/24/2014    DUNFORD, CHRISTINA P, PTA 03/15/2015, 3:26 PM  [facility] [address] [place], [place], [address] Phone: [phone]   Fax:  [phone]

## 2015-03-20 ENCOUNTER — Ambulatory Visit: Payer: Medicare Other | Admitting: Physical Therapy

## 2015-03-20 DIAGNOSIS — M25511 Pain in right shoulder: Secondary | ICD-10-CM

## 2015-03-20 DIAGNOSIS — M25611 Stiffness of right shoulder, not elsewhere classified: Secondary | ICD-10-CM | POA: Diagnosis not present

## 2015-03-20 NOTE — Therapy (Signed)
[facility] [place], [place], [address] Phone: [phone]   Fax:  [phone]  Physical Therapy Treatment  Patient Details  Name: ADISA LITT MRN: [id number] Date of Birth: 1932-09-04 Referring Provider:  Christain Sacramento, MD  Encounter Date: 03/20/2015      PT End of Session - 03/20/15 1136    Visit Number 4   Number of Visits 12   Date for PT Re-Evaluation 02/28/15   PT Start Time 1136   PT Stop Time 1207   PT Time Calculation (min) 31 min   Activity Tolerance Patient tolerated treatment well;Patient limited by pain   Behavior During Therapy [facility] for tasks assessed/performed      Past Medical History  Diagnosis Date  . Cancer     Prostate cancer - radiation  . Hypertension   . High cholesterol   . Arthritis   . Bell's palsy   . Cataract     has had surgery  . Diabetes mellitus without complication     type 2  . Chronic kidney disease     Cr 1.40 - 2.0 from ~ 2010 - 2015    Past Surgical History  Procedure Laterality Date  . Eye surgery Left     cataract surgery with lens implant  . Tonsillectomy    . Tympanoplasty Left     wears hearing aids  . Colonoscopy    . Anterior cervical decomp/discectomy fusion N/A 01/24/2014    Procedure: ANTERIOR CERVICAL DECOMPRESSION/DISCECTOMY FUSION 3 LEVELS Cervical three/four,four/five, five/six  anterior cervical decompression with fusion interbody prosthesis plating and bonegraft;  Surgeon: Newman Pies, MD;  Location: [place] [facility];  Service: Neurosurgery;  Laterality: N/A;    There were no vitals filed for this visit.  Visit Diagnosis:  Shoulder stiffness, right  Right shoulder pain      Subjective Assessment - 03/20/15 1136    Subjective Pt 20 min late for appt. He reports shoulder hurts today.   Currently in Pain? Yes   Pain Score 7    Pain Location Shoulder   Pain Orientation Right   Pain Descriptors / Indicators Aching                          [facility] Adult PT Treatment/Exercise - 03/20/15 0001    Shoulder Exercises: Supine   Protraction 15 reps   Other Supine Exercises reviewed and advised staying in range from chest to [facility]   Shoulder Exercises: Seated   Row 15 reps;Strengthening;Both;Theraband   Theraband Level (Shoulder Row) Level 2 (Red)   Shoulder Exercises: Standing   Flexion 5 reps;AAROM  doorway; painful   Manual Therapy   Manual Therapy Joint mobilization;Scapular mobilization;Passive ROM   Joint Mobilization GH joint for flexion/ER   Scapular Mobilization in sidelying for protraction, elev/dep   Passive ROM R shoulder flex, scaption, ER                 PT Education - 03/20/15 1213    Education provided Yes   Education Details HEP: rows with red band   Person(s) Educated Patient   Methods Explanation;Demonstration;Handout   Comprehension Returned demonstration;Verbalized understanding          PT Short Term Goals - 02/17/15 0914    PT SHORT TERM GOAL #1   Title Independent with an HEP.   Time 2   Period Weeks   Status Achieved  PT Long Term Goals - 03/09/15 1542    PT LONG TERM GOAL #1   Title Active right shoulder flexion to 145 degrees so the patient can easily reach overhead   Time 4   Status New   PT LONG TERM GOAL #2   Title Active ER to 70 degrees+ to allow for easily donning/doffing of apparel   Time 4   Period Weeks   Status New   PT LONG TERM GOAL #3   Title Increase ROM so patient is able to reach behind back to L3 with right hand.   Time 4   Period Weeks   Status New   PT LONG TERM GOAL #4   Title Increase shoulder strength to a solid 4+/5 to increase stability for performance of functional activities   Time 4   Period Weeks   Status New   PT LONG TERM GOAL #5   Title Perform ADL's with pain not > 3/10.   Time 4   Period Weeks   Status New   Additional Long Term Goals   Additional Long Term Goals Yes   PT LONG TERM  GOAL #6   Title Ind with HEP.   Time 4   Period Weeks   Status New               Plan - 03/20/15 1214    Clinical Impression Statement Patient had difficulty relaxing with PROM today and had decreased ROM in flexion. He responded well to scapular mobs and TE. He is limited by pain and muscle guarding.   PT Next Visit Plan Cont with POC per MPT AAROM, scapular strengthening and mobility   Consulted and Agree with Plan of Care Patient        Problem List Patient Active Problem List   Diagnosis Date Noted  . Spondylolisthesis of lumbar region 06/30/2014  . Cervical spondylosis with myelopathy 01/24/2014    Madelyn Flavors PT  03/20/2015, 12:17 PM  [facility] [place], [place], [address] Phone: [phone]   Fax:  [phone]

## 2015-03-20 NOTE — Patient Instructions (Signed)
   Resistive Band Rowing   With resistive band anchored in door, grasp both ends. Keeping elbows bent, pull back, squeezing shoulder blades together. Hold _3-5___ seconds. Repeat _10-30___ times. Do _1___ sessions per day. 1 http://gt2.exer.us/98   Copyright  VHI. All rights reserved.  Madelyn Flavors, PT 03/20/2015 12:02 PM [facility] [place], [place], [address] Phone: [phone]   Fax:  [phone]

## 2015-03-22 ENCOUNTER — Ambulatory Visit: Payer: Medicare Other | Admitting: Physical Therapy

## 2015-03-22 DIAGNOSIS — M25611 Stiffness of right shoulder, not elsewhere classified: Secondary | ICD-10-CM | POA: Diagnosis not present

## 2015-03-22 DIAGNOSIS — M25511 Pain in right shoulder: Secondary | ICD-10-CM

## 2015-03-22 NOTE — Therapy (Signed)
[facility] [place], [place], [address] Phone: [phone]   Fax:  [phone]  Physical Therapy Treatment  Patient Details  Name: Ricky Holden MRN: [id number] Date of Birth: [birth year] Referring Provider:  Christain Sacramento, MD  Encounter Date: 03/22/2015      PT End of Session - 03/22/15 1305    Visit Number 5   Number of Visits 12   Date for PT Re-Evaluation 02/28/15   PT Start Time 1118   PT Stop Time 1154   PT Time Calculation (min) 36 min   Activity Tolerance Patient tolerated treatment well;Patient limited by pain   Behavior During Therapy [facility] for tasks assessed/performed      Past Medical History  Diagnosis Date  . Cancer     Prostate cancer - radiation  . Hypertension   . High cholesterol   . Arthritis   . Bell's palsy   . Cataract     has had surgery  . Diabetes mellitus without complication     type 2  . Chronic kidney disease     Cr 1.40 - 2.0 from ~ 2010 - 2015    Past Surgical History  Procedure Laterality Date  . Eye surgery Left     cataract surgery with lens implant  . Tonsillectomy    . Tympanoplasty Left     wears hearing aids  . Colonoscopy    . Anterior cervical decomp/discectomy fusion N/A 01/24/2014    Procedure: ANTERIOR CERVICAL DECOMPRESSION/DISCECTOMY FUSION 3 LEVELS Cervical three/four,four/five, five/six  anterior cervical decompression with fusion interbody prosthesis plating and bonegraft;  Surgeon: Newman Pies, MD;  Location: [place] [facility];  Service: Neurosurgery;  Laterality: N/A;    There were no vitals filed for this visit.  Visit Diagnosis:  Shoulder stiffness, right  Right shoulder pain      Subjective Assessment - 03/22/15 1301    Subjective About the same.   Pain Score 7    Pain Location Shoulder   Pain Orientation Right   Pain Descriptors / Indicators Aching   Pain Type Surgical pain   Pain Onset 1 to 4 weeks ago   Pain Frequency Constant                          [facility] Adult PT Treatment/Exercise - 03/22/15 0001    Exercises   Exercises Shoulder   Shoulder Exercises: ROM/Strengthening   UBE (Upper Arm Bike) Active-asistive UBE x 10 minutes at 120 RPM's.   Manual Therapy   Manual Therapy Passive ROM   Manual therapy comments Right shoulder PROM into flexiion and ER in plane of scapula x 18 minutes.                  PT Short Term Goals - 02/17/15 0914    PT SHORT TERM GOAL #1   Title Independent with an HEP.   Time 2   Period Weeks   Status Achieved           PT Long Term Goals - 03/09/15 1542    PT LONG TERM GOAL #1   Title Active right shoulder flexion to 145 degrees so the patient can easily reach overhead   Time 4   Status New   PT LONG TERM GOAL #2   Title Active ER to 70 degrees+ to allow for easily donning/doffing of apparel   Time 4   Period Weeks   Status New   PT  LONG TERM GOAL #3   Title Increase ROM so patient is able to reach behind back to L3 with right hand.   Time 4   Period Weeks   Status New   PT LONG TERM GOAL #4   Title Increase shoulder strength to a solid 4+/5 to increase stability for performance of functional activities   Time 4   Period Weeks   Status New   PT LONG TERM GOAL #5   Title Perform ADL's with pain not > 3/10.   Time 4   Period Weeks   Status New   Additional Long Term Goals   Additional Long Term Goals Yes   PT LONG TERM GOAL #6   Title Ind with HEP.   Time 4   Period Weeks   Status New               Problem List Patient Active Problem List   Diagnosis Date Noted  . Spondylolisthesis of lumbar region 06/30/2014  . Cervical spondylosis with myelopathy 01/24/2014    APPLEGATE, [place] MPT 03/22/2015, 1:07 PM  [facility] [address] [place], [place], [address] Phone: [phone]   Fax:  [phone]

## 2015-03-27 ENCOUNTER — Ambulatory Visit: Payer: Medicare Other | Attending: Orthopedic Surgery | Admitting: Physical Therapy

## 2015-03-27 DIAGNOSIS — M25511 Pain in right shoulder: Secondary | ICD-10-CM | POA: Diagnosis present

## 2015-03-27 DIAGNOSIS — M25611 Stiffness of right shoulder, not elsewhere classified: Secondary | ICD-10-CM | POA: Insufficient documentation

## 2015-03-27 NOTE — Therapy (Signed)
[facility] [place], [place], [address] Phone: [phone]   Fax:  [phone]  Physical Therapy Treatment  Patient Details  Name: Ricky Holden MRN: [id number] Date of Birth: 1933/01/24 Referring Provider:  Christain Sacramento, MD  Encounter Date: 03/27/2015      PT End of Session - 03/27/15 1124    Visit Number 6   Number of Visits 12   Date for PT Re-Evaluation 02/28/15   PT Start Time 1116   PT Stop Time 1202   PT Time Calculation (min) 46 min   Activity Tolerance Patient tolerated treatment well   Behavior During Therapy [facility] for tasks assessed/performed      Past Medical History  Diagnosis Date  . Cancer     Prostate cancer - radiation  . Hypertension   . High cholesterol   . Arthritis   . Bell's palsy   . Cataract     has had surgery  . Diabetes mellitus without complication     type 2  . Chronic kidney disease     Cr 1.40 - 2.0 from ~ 2010 - 2015    Past Surgical History  Procedure Laterality Date  . Eye surgery Left     cataract surgery with lens implant  . Tonsillectomy    . Tympanoplasty Left     wears hearing aids  . Colonoscopy    . Anterior cervical decomp/discectomy fusion N/A 01/24/2014    Procedure: ANTERIOR CERVICAL DECOMPRESSION/DISCECTOMY FUSION 3 LEVELS Cervical three/four,four/five, five/six  anterior cervical decompression with fusion interbody prosthesis plating and bonegraft;  Surgeon: Newman Pies, MD;  Location: [place] [facility];  Service: Neurosurgery;  Laterality: N/A;    There were no vitals filed for this visit.  Visit Diagnosis:  Shoulder stiffness, right  Right shoulder pain      Subjective Assessment - 03/27/15 1258    Subjective Patient reports his pain is about the same.   Patient Stated Goals Use right UE without pain.   Currently in Pain? Yes   Pain Score 7   with end range motion   Pain Location Shoulder   Pain Orientation Right   Pain Descriptors /  Indicators Aching                         [facility] Adult PT Treatment/Exercise - 03/27/15 0001    Shoulder Exercises: Supine   External Rotation Strengthening;Both;20 reps;Weights   External Rotation Weight (lbs) 2   Internal Rotation Strengthening;20 reps;Weights;Both   Internal Rotation Weight (lbs) 2   Shoulder Exercises: Sidelying   External Rotation 10 reps;Strengthening;Weights   External Rotation Weight (lbs) 2   Shoulder Exercises: Standing   Flexion AAROM;Right;10 reps  PT assist   ABduction AAROM;Right;10 reps  cane   Retraction AROM;Strengthening;Both;10 reps                PT Education - 03/27/15 1308    Education provided Yes   Education Details supine cane ER and ABD; supine robber/money with 2#   Person(s) Educated Patient   Methods Explanation;Demonstration;Handout  hep2 go h/o   Comprehension Verbalized understanding;Returned demonstration          PT Short Term Goals - 02/17/15 0914    PT SHORT TERM GOAL #1   Title Independent with an HEP.   Time 2   Period Weeks   Status Achieved           PT Long Term Goals -  03/09/15 1542    PT LONG TERM GOAL #1   Title Active right shoulder flexion to 145 degrees so the patient can easily reach overhead   Time 4   Status New   PT LONG TERM GOAL #2   Title Active ER to 70 degrees+ to allow for easily donning/doffing of apparel   Time 4   Period Weeks   Status New   PT LONG TERM GOAL #3   Title Increase ROM so patient is able to reach behind back to L3 with right hand.   Time 4   Period Weeks   Status New   PT LONG TERM GOAL #4   Title Increase shoulder strength to a solid 4+/5 to increase stability for performance of functional activities   Time 4   Period Weeks   Status New   PT LONG TERM GOAL #5   Title Perform ADL's with pain not > 3/10.   Time 4   Period Weeks   Status New   Additional Long Term Goals   Additional Long Term Goals Yes   PT LONG TERM GOAL #6   Title  Ind with HEP.   Time 4   Period Weeks   Status New               Plan - 03/27/15 1311    Clinical Impression Statement We worked on postural exercises today standing at wall. Pt demo's improved ease of ROM in this position but still has pain at end range. Added more cane exercises for ER and ABD. Unsure how compliant paitent is with ROM based on comments. He tolerated posterior shoulder strengthening well without increased pain.    PT Next Visit Plan Cont with POC per MPT AAROM, scapular strengthening and mobility.  Returns to MD 04/06/15.        Problem List Patient Active Problem List   Diagnosis Date Noted  . Spondylolisthesis of lumbar region 06/30/2014  . Cervical spondylosis with myelopathy 01/24/2014    Madelyn Flavors PT  03/27/2015, 1:15 PM  [facility] [place], [place], [address] Phone: [phone]   Fax:  [phone]

## 2015-03-29 ENCOUNTER — Ambulatory Visit: Payer: Medicare Other | Admitting: Physical Therapy

## 2015-03-29 ENCOUNTER — Encounter: Payer: Self-pay | Admitting: Physical Therapy

## 2015-03-29 DIAGNOSIS — M25611 Stiffness of right shoulder, not elsewhere classified: Secondary | ICD-10-CM

## 2015-03-29 DIAGNOSIS — M25511 Pain in right shoulder: Secondary | ICD-10-CM

## 2015-03-29 NOTE — Therapy (Signed)
[facility] [place], [place], [address] Phone: [phone]   Fax:  [phone]  Physical Therapy Treatment  Patient Details  Name: Ricky Holden MRN: [id number] Date of Birth: 1932/11/30 Referring Provider:  Christain Sacramento, MD  Encounter Date: 03/29/2015      PT End of Session - 03/29/15 1122    Visit Number 7   Number of Visits 12   Date for PT Re-Evaluation 05/09/15   PT Start Time 1114   PT Stop Time 1159   PT Time Calculation (min) 45 min   Activity Tolerance Patient tolerated treatment well   Behavior During Therapy [facility] for tasks assessed/performed      Past Medical History  Diagnosis Date  . Cancer [facility])     Prostate cancer - radiation  . Hypertension   . High cholesterol   . Arthritis   . Bell's palsy   . Cataract     has had surgery  . Diabetes mellitus without complication ([place])     type 2  . Chronic kidney disease     Cr 1.40 - 2.0 from ~ 2010 - 2015    Past Surgical History  Procedure Laterality Date  . Eye surgery Left     cataract surgery with lens implant  . Tonsillectomy    . Tympanoplasty Left     wears hearing aids  . Colonoscopy    . Anterior cervical decomp/discectomy fusion N/A 01/24/2014    Procedure: ANTERIOR CERVICAL DECOMPRESSION/DISCECTOMY FUSION 3 LEVELS Cervical three/four,four/five, five/six  anterior cervical decompression with fusion interbody prosthesis plating and bonegraft;  Surgeon: Newman Pies, MD;  Location: [place] [facility];  Service: Neurosurgery;  Laterality: N/A;    There were no vitals filed for this visit.  Visit Diagnosis:  Shoulder stiffness, right  Right shoulder pain      Subjective Assessment - 03/29/15 1120    Subjective Patient reports some pain yet took meds   Patient Stated Goals Use right UE without pain.   Currently in Pain? Yes   Pain Score 5    Pain Location Shoulder   Pain Orientation Right   Pain Descriptors / Indicators Aching   Pain Type Surgical pain   Pain Onset 1 to 4 weeks ago   Aggravating Factors  certain movements   Pain Relieving Factors rest            [facility] PT Assessment - 03/29/15 0001    AROM   Overall AROM  Deficits   AROM Assessment Site Shoulder   Right/Left Shoulder Right   Right Shoulder Flexion 101 Degrees   Right Shoulder External Rotation 60 Degrees   PROM   Overall PROM  Deficits   PROM Assessment Site Shoulder   Right/Left Shoulder Right   Right Shoulder Flexion 125 Degrees   Right Shoulder External Rotation 71 Degrees                     [facility] Adult PT Treatment/Exercise - 03/29/15 0001    Shoulder Exercises: Supine   Other Supine Exercises supine cane for flexion 2x10   Shoulder Exercises: Seated   Other Seated Exercises seated cane flexion to 90 degrees 2x10   Shoulder Exercises: Pulleys   Flexion 3 minutes   Shoulder Exercises: ROM/Strengthening   UBE (Upper Arm Bike) 58min @ 120 RPM   Manual Therapy   Manual Therapy Passive ROM   Manual therapy comments Right shoulder PROM into flexiion and ER iwith genlte range, then  rhythnic stabs for flex/ext at 90 degrees and IR/ER in scaption                  PT Short Term Goals - 02/17/15 0914    PT SHORT TERM GOAL #1   Title Independent with an HEP.   Time 2   Period Weeks   Status Achieved           PT Long Term Goals - 03/09/15 1542    PT LONG TERM GOAL #1   Title Active right shoulder flexion to 145 degrees so the patient can easily reach overhead   Time 4   Status New   PT LONG TERM GOAL #2   Title Active ER to 70 degrees+ to allow for easily donning/doffing of apparel   Time 4   Period Weeks   Status New   PT LONG TERM GOAL #3   Title Increase ROM so patient is able to reach behind back to L3 with right hand.   Time 4   Period Weeks   Status New   PT LONG TERM GOAL #4   Title Increase shoulder strength to a solid 4+/5 to increase stability for performance of functional activities    Time 4   Period Weeks   Status New   PT LONG TERM GOAL #5   Title Perform ADL's with pain not > 3/10.   Time 4   Period Weeks   Status New   Additional Long Term Goals   Additional Long Term Goals Yes   PT LONG TERM GOAL #6   Title Ind with HEP.   Time 4   Period Weeks   Status New               Plan - 03/29/15 1151    Clinical Impression Statement Patient progressing slowly with activities. Patient had decreased ROM prior to manual stretching today, yet improved after gentle ROM activities. Educated patient on self ROM with flexion esp supine with cane to improve ROM and functional independence. Goals ongoing due to pain, ROM and strength deficits.   Pt will benefit from skilled therapeutic intervention in order to improve on the following deficits Pain;Decreased activity tolerance;Decreased range of motion   Rehab Potential Good   PT Frequency 3x / week   PT Duration 4 weeks   PT Treatment/Interventions ADLs/Self Care Home Management;Electrical Stimulation;Cryotherapy;Ultrasound;Therapeutic activities;Therapeutic exercise;Manual techniques;Patient/family education;Neuromuscular re-education;Vasopneumatic Device   PT Next Visit Plan Cont with POC per MPT AAROM, scapular strengthening and mobility.  (MD Earlie Server 04/06/15)   Consulted and Agree with Plan of Care Patient        Problem List Patient Active Problem List   Diagnosis Date Noted  . Spondylolisthesis of lumbar region 06/30/2014  . Cervical spondylosis with myelopathy 01/24/2014    DUNFORD, CHRISTINA P, PTA 03/29/2015, 11:59 AM  [facility] [place], [place], [address] Phone: [phone]   Fax:  [phone]

## 2015-04-03 ENCOUNTER — Encounter: Payer: Self-pay | Admitting: Physical Therapy

## 2015-04-03 ENCOUNTER — Ambulatory Visit: Payer: Medicare Other | Admitting: Physical Therapy

## 2015-04-03 DIAGNOSIS — M25611 Stiffness of right shoulder, not elsewhere classified: Secondary | ICD-10-CM | POA: Diagnosis not present

## 2015-04-03 DIAGNOSIS — M25511 Pain in right shoulder: Secondary | ICD-10-CM

## 2015-04-03 NOTE — Therapy (Signed)
[facility] [place], [place], [address] Phone: [phone]   Fax:  [phone]  Physical Therapy Treatment  Patient Details  Name: Ricky Holden MRN: [id number] Date of Birth: 1933/01/06 Referring Provider:  Christain Sacramento, MD  Encounter Date: 04/03/2015      PT End of Session - 04/03/15 1147    Visit Number 8   Number of Visits 12   Date for PT Re-Evaluation 05/09/15   PT Start Time 1114   PT Stop Time 1156   PT Time Calculation (min) 42 min   Activity Tolerance Patient tolerated treatment well   Behavior During Therapy [facility] for tasks assessed/performed      Past Medical History  Diagnosis Date  . Cancer [facility])     Prostate cancer - radiation  . Hypertension   . High cholesterol   . Arthritis   . Bell's palsy   . Cataract     has had surgery  . Diabetes mellitus without complication ([place])     type 2  . Chronic kidney disease     Cr 1.40 - 2.0 from ~ 2010 - 2015    Past Surgical History  Procedure Laterality Date  . Eye surgery Left     cataract surgery with lens implant  . Tonsillectomy    . Tympanoplasty Left     wears hearing aids  . Colonoscopy    . Anterior cervical decomp/discectomy fusion N/A 01/24/2014    Procedure: ANTERIOR CERVICAL DECOMPRESSION/DISCECTOMY FUSION 3 LEVELS Cervical three/four,four/five, five/six  anterior cervical decompression with fusion interbody prosthesis plating and bonegraft;  Surgeon: Newman Pies, MD;  Location: [place] [facility];  Service: Neurosurgery;  Laterality: N/A;    There were no vitals filed for this visit.  Visit Diagnosis:  Shoulder stiffness, right  Right shoulder pain      Subjective Assessment - 04/03/15 1127    Subjective Patient reports some pain yet took meds   Patient Stated Goals Use right UE without pain.   Currently in Pain? Yes   Pain Score 4    Pain Orientation Right   Pain Descriptors / Indicators Aching   Pain Type Surgical pain   Pain Onset 1 to 4 weeks ago   Pain Frequency Constant   Aggravating Factors  certain movements   Effect of Pain on Daily Activities rest            [facility] PT Assessment - 04/03/15 0001    AROM   Overall AROM  Within functional limits for tasks performed;Deficits   AROM Assessment Site Shoulder   Right/Left Shoulder Right   Right Shoulder Flexion 106 Degrees   Right Shoulder External Rotation 70 Degrees   PROM   Overall PROM  Within functional limits for tasks performed;Deficits   PROM Assessment Site Shoulder   Right/Left Shoulder Right   Right Shoulder Flexion 126 Degrees   Right Shoulder External Rotation 75 Degrees                     [facility] Adult PT Treatment/Exercise - 04/03/15 0001    Shoulder Exercises: Supine   Other Supine Exercises supine cane for flexion 2x10   Shoulder Exercises: Pulleys   Flexion --  32mn   Shoulder Exercises: ROM/Strengthening   UBE (Upper Arm Bike) 875m @ 120 RPM   Manual Therapy   Manual Therapy Passive ROM   Manual therapy comments Right shoulder PROM into flexiion and ER iwith genlte range, then rhythnic stabs for  flex/ext at 90 degrees and IR/ER in scaption                  PT Short Term Goals - 02/17/15 0914    PT SHORT TERM GOAL #1   Title Independent with an HEP.   Time 2   Period Weeks   Status Achieved           PT Long Term Goals - 04/03/15 1148    PT LONG TERM GOAL #1   Title Active right shoulder flexion to 145 degrees so the patient can easily reach overhead   Time 4   Period Weeks   Status On-going   PT LONG TERM GOAL #2   Title Active ER to 70 degrees+ to allow for easily donning/doffing of apparel   Time 4   Period Weeks   Status Achieved  70 (04/03/15)   PT LONG TERM GOAL #3   Title Increase ROM so patient is able to reach behind back to L3 with right hand.   Time 4   Period Weeks   Status On-going   PT LONG TERM GOAL #4   Title Increase shoulder strength to a solid 4+/5 to  increase stability for performance of functional activities   Time 4   Period Weeks   Status On-going   PT LONG TERM GOAL #5   Title Perform ADL's with pain not > 3/10.   Time 4   Period Weeks   Status On-going   PT LONG TERM GOAL #6   Title Ind with HEP.   Time 4   Period Weeks   Status On-going               Plan - 04/03/15 1150    Clinical Impression Statement Patient progressing with improved ER ROM, yet right shoulder flexion improved yet very stiff and guarded with manual PROM. Educated patient on continued self stretching for flexion to improve ROM and functional independence. Met LTG #2 others ongoing due to pain, ROM and strength deficits.   Pt will benefit from skilled therapeutic intervention in order to improve on the following deficits Pain;Decreased activity tolerance;Decreased range of motion   Rehab Potential Good   PT Frequency 3x / week   PT Duration 4 weeks   PT Treatment/Interventions ADLs/Self Care Home Management;Electrical Stimulation;Cryotherapy;Ultrasound;Therapeutic activities;Therapeutic exercise;Manual techniques;Patient/family education;Neuromuscular re-education;Vasopneumatic Device   PT Next Visit Plan Cont with POC per MPT AAROM, scapular strengthening and mobility.  (MD Earlie Server 04/06/15)   Consulted and Agree with Plan of Care Patient        Problem List Patient Active Problem List   Diagnosis Date Noted  . Spondylolisthesis of lumbar region 06/30/2014  . Cervical spondylosis with myelopathy 01/24/2014    Stephaniemarie Stoffel P, PTA 04/03/2015, 11:58 AM  [facility] [place], [place], [address] Phone: [phone]   Fax:  [phone]

## 2015-04-05 ENCOUNTER — Encounter: Payer: Self-pay | Admitting: Physical Therapy

## 2015-04-05 ENCOUNTER — Ambulatory Visit: Payer: Medicare Other | Admitting: Physical Therapy

## 2015-04-05 DIAGNOSIS — M25511 Pain in right shoulder: Secondary | ICD-10-CM

## 2015-04-05 DIAGNOSIS — M25611 Stiffness of right shoulder, not elsewhere classified: Secondary | ICD-10-CM | POA: Diagnosis not present

## 2015-04-05 NOTE — Therapy (Signed)
[facility] [place], [place], [address] Phone: [phone]   Fax:  [phone]  Physical Therapy Treatment  Patient Details  Name: Ricky Holden MRN: [id number] Date of Birth: 1933-03-27 Referring Provider:  Christain Sacramento, MD  Encounter Date: 04/05/2015      PT End of Session - 04/05/15 1151    Visit Number 9   Number of Visits 12   Date for PT Re-Evaluation 05/09/15   PT Start Time 1114   PT Stop Time 1155   PT Time Calculation (min) 41 min   Activity Tolerance Patient tolerated treatment well   Behavior During Therapy [facility] for tasks assessed/performed      Past Medical History  Diagnosis Date  . Cancer [facility]     Prostate cancer - radiation  . Hypertension   . High cholesterol   . Arthritis   . Bell's palsy   . Cataract     has had surgery  . Diabetes mellitus without complication ([place])     type 2  . Chronic kidney disease     Cr 1.40 - 2.0 from ~ 2010 - 2015    Past Surgical History  Procedure Laterality Date  . Eye surgery Left     cataract surgery with lens implant  . Tonsillectomy    . Tympanoplasty Left     wears hearing aids  . Colonoscopy    . Anterior cervical decomp/discectomy fusion N/A 01/24/2014    Procedure: ANTERIOR CERVICAL DECOMPRESSION/DISCECTOMY FUSION 3 LEVELS Cervical three/four,four/five, five/six  anterior cervical decompression with fusion interbody prosthesis plating and bonegraft;  Surgeon: Newman Pies, MD;  Location: [place] [facility]S;  Service: Neurosurgery;  Laterality: N/A;    There were no vitals filed for this visit.  Visit Diagnosis:  Shoulder stiffness, right  Right shoulder pain      Subjective Assessment - 04/05/15 1116    Subjective Patient reported doing exercises at home 'some' some soreness in shoulder per patient   Patient Stated Goals Use right UE without pain.   Currently in Pain? Yes   Pain Score 4    Pain Location Shoulder   Pain Orientation Right    Pain Descriptors / Indicators Aching   Pain Type Surgical pain   Pain Onset 1 to 4 weeks ago   Pain Frequency Constant   Aggravating Factors  overhead movement or use of shoulder   Pain Relieving Factors rest            [facility] PT Assessment - 04/05/15 0001    AROM   Overall AROM  Within functional limits for tasks performed;Deficits   AROM Assessment Site Shoulder   Right/Left Shoulder Right   Right Shoulder Flexion 106 Degrees   Right Shoulder External Rotation 71 Degrees   PROM   Overall PROM  Within functional limits for tasks performed;Deficits   PROM Assessment Site Shoulder   Right/Left Shoulder Right   Right Shoulder Flexion 128 Degrees   Right Shoulder External Rotation 75 Degrees                     [facility] Adult PT Treatment/Exercise - 04/05/15 0001    Shoulder Exercises: Supine   Other Supine Exercises supine cane for flexion 2x10   Shoulder Exercises: Pulleys   Flexion --  87min with cues for hold at top range   Other Pulley Exercises UE ranger for elevation 3x10   Shoulder Exercises: ROM/Strengthening   UBE (Upper Arm Bike) 48min @ 120  RPM   Manual Therapy   Manual Therapy Passive ROM   Manual therapy comments Right shoulder PROM into flexiion and ER iwith genlte range, then rhythnic stabs for flex/ext at 90 degrees and IR/ER in scaption                  PT Short Term Goals - 02/17/15 0914    PT SHORT TERM GOAL #1   Title Independent with an HEP.   Time 2   Period Weeks   Status Achieved           PT Long Term Goals - 04/05/15 1152    PT LONG TERM GOAL #1   Title Active right shoulder flexion to 145 degrees so the patient can easily reach overhead   Time 4   Period Weeks   Status On-going  AROM 106 degrees (04/05/15)   PT LONG TERM GOAL #2   Title Active ER to 70 degrees+ to allow for easily donning/doffing of apparel   Time 4   Period Weeks   Status Achieved   PT LONG TERM GOAL #3   Title Increase ROM so patient is  able to reach behind back to L3 with right hand.   Time 4   Period Weeks   Status On-going   PT LONG TERM GOAL #4   Title Increase shoulder strength to a solid 4+/5 to increase stability for performance of functional activities   Time 4   Period Weeks   Status On-going   PT LONG TERM GOAL #5   Title Perform ADL's with pain not > 3/10.   Time 4   Period Weeks   Status On-going               Plan - 04/05/15 1153    Clinical Impression Statement Patient progressing with ER ROM yet shoulder flexion continues to improve at a slow increase due to pain and stiffness. Patient was encouraged to continue self stretches to improve ROM and functional independence. Slowly progressing with strengthening due to flexion ROM deficits. Goals ongoing due to pain, strength deficits and ROM for flexion limitations.    Pt will benefit from skilled therapeutic intervention in order to improve on the following deficits Pain;Decreased activity tolerance;Decreased range of motion   Rehab Potential Good   PT Frequency 3x / week   PT Duration 4 weeks   PT Treatment/Interventions ADLs/Self Care Home Management;Electrical Stimulation;Cryotherapy;Ultrasound;Therapeutic activities;Therapeutic exercise;Manual techniques;Patient/family education;Neuromuscular re-education;Vasopneumatic Device   PT Next Visit Plan Cont with POC and progress per MD Earlie Server 04/06/15   Consulted and Agree with Plan of Care Patient        Problem List Patient Active Problem List   Diagnosis Date Noted  . Spondylolisthesis of lumbar region 06/30/2014  . Cervical spondylosis with myelopathy 01/24/2014   Ladean Raya, PTA 04/05/2015 11:57 AM  [place] Retana, Venetia Maxon, PTA 04/05/2015, 11:57 AM  [facility] [address] [place], [place], [address] Phone: [phone]   Fax:  [phone]

## 2015-04-05 NOTE — Therapy (Signed)
[facility] [place], [place], [address] Phone: [phone]   Fax:  [phone]  Physical Therapy Treatment  Patient Details  Name: Ricky Holden MRN: [id number] Date of Birth: 1932-09-20 Referring Provider:  Christain Sacramento, MD  Encounter Date: 04/05/2015      PT End of Session - 04/05/15 1151    Visit Number 9   Number of Visits 12   Date for PT Re-Evaluation 05/09/15   PT Start Time 1114   PT Stop Time 1155   PT Time Calculation (min) 41 min   Activity Tolerance Patient tolerated treatment well   Behavior During Therapy [facility] for tasks assessed/performed      Past Medical History  Diagnosis Date  . Cancer [facility])     Prostate cancer - radiation  . Hypertension   . High cholesterol   . Arthritis   . Bell's palsy   . Cataract     has had surgery  . Diabetes mellitus without complication ([place])     type 2  . Chronic kidney disease     Cr 1.40 - 2.0 from ~ 2010 - 2015    Past Surgical History  Procedure Laterality Date  . Eye surgery Left     cataract surgery with lens implant  . Tonsillectomy    . Tympanoplasty Left     wears hearing aids  . Colonoscopy    . Anterior cervical decomp/discectomy fusion N/A 01/24/2014    Procedure: ANTERIOR CERVICAL DECOMPRESSION/DISCECTOMY FUSION 3 LEVELS Cervical three/four,four/five, five/six  anterior cervical decompression with fusion interbody prosthesis plating and bonegraft;  Surgeon: Newman Pies, MD;  Location: [place] NEURO ORS;  Service: Neurosurgery;  Laterality: N/A;    There were no vitals filed for this visit.  Visit Diagnosis:  Shoulder stiffness, right  Right shoulder pain      Subjective Assessment - 04/05/15 1116    Subjective Patient reported doing exercises at home 'some' some soreness in shoulder per patient   Patient Stated Goals Use right UE without pain.   Currently in Pain? Yes   Pain Score 4    Pain Location Shoulder   Pain Orientation Right    Pain Descriptors / Indicators Aching   Pain Type Surgical pain   Pain Onset 1 to 4 weeks ago   Pain Frequency Constant   Aggravating Factors  overhead movement or use of shoulder   Pain Relieving Factors rest            [facility] PT Assessment - 04/05/15 0001    AROM   Overall AROM  Within functional limits for tasks performed;Deficits   AROM Assessment Site Shoulder   Right/Left Shoulder Right   Right Shoulder Flexion 106 Degrees   Right Shoulder External Rotation 71 Degrees   PROM   Overall PROM  Within functional limits for tasks performed;Deficits   PROM Assessment Site Shoulder   Right/Left Shoulder Right   Right Shoulder Flexion 128 Degrees   Right Shoulder External Rotation 75 Degrees                     [facility] Adult PT Treatment/Exercise - 04/05/15 0001    Shoulder Exercises: Supine   Other Supine Exercises supine cane for flexion 2x10   Shoulder Exercises: Pulleys   Flexion --  48min with cues for hold at top range   Other Pulley Exercises UE ranger for elevation 3x10   Shoulder Exercises: ROM/Strengthening   UBE (Upper Arm Bike) 57min @ 120  RPM   Manual Therapy   Manual Therapy Passive ROM   Manual therapy comments Right shoulder PROM into flexiion and ER iwith genlte range, then rhythnic stabs for flex/ext at 90 degrees and IR/ER in scaption                  PT Short Term Goals - 02/17/15 0914    PT SHORT TERM GOAL #1   Title Independent with an HEP.   Time 2   Period Weeks   Status Achieved           PT Long Term Goals - 04/05/15 1152    PT LONG TERM GOAL #1   Title Active right shoulder flexion to 145 degrees so the patient can easily reach overhead   Time 4   Period Weeks   Status On-going  AROM 106 degrees (04/05/15)   PT LONG TERM GOAL #2   Title Active ER to 70 degrees+ to allow for easily donning/doffing of apparel   Time 4   Period Weeks   Status Achieved   PT LONG TERM GOAL #3   Title Increase ROM so patient is  able to reach behind back to L3 with right hand.   Time 4   Period Weeks   Status On-going   PT LONG TERM GOAL #4   Title Increase shoulder strength to a solid 4+/5 to increase stability for performance of functional activities   Time 4   Period Weeks   Status On-going   PT LONG TERM GOAL #5   Title Perform ADL's with pain not > 3/10.   Time 4   Period Weeks   Status On-going               Plan - 04/05/15 1153    Clinical Impression Statement Patient progressing with ER ROM yet shoulder flexion continues to improve at a slow increase due to pain and stiffness. Patient was encouraged to continue self stretches to improve ROM and functional independence. Slowly progressing with strengthening due to flexion ROM deficits. Goals ongoing due to pain, strength deficits and ROM for flexion limitations.    Pt will benefit from skilled therapeutic intervention in order to improve on the following deficits Pain;Decreased activity tolerance;Decreased range of motion   Rehab Potential Good   PT Frequency 3x / week   PT Duration 4 weeks   PT Treatment/Interventions ADLs/Self Care Home Management;Electrical Stimulation;Cryotherapy;Ultrasound;Therapeutic activities;Therapeutic exercise;Manual techniques;Patient/family education;Neuromuscular re-education;Vasopneumatic Device   PT Next Visit Plan Cont with POC and progress per MD Earlie Server 04/06/15   Consulted and Agree with Plan of Care Patient        Problem List Patient Active Problem List   Diagnosis Date Noted  . Spondylolisthesis of lumbar region 06/30/2014  . Cervical spondylosis with myelopathy 01/24/2014    Nastashia Gallo, [place] MPT 04/05/2015, 12:45 PM  [facility] [place] [address] [place], [place], [address] Phone: [phone]   Fax:  [phone]

## 2015-04-10 ENCOUNTER — Encounter: Payer: Self-pay | Admitting: Physical Therapy

## 2015-04-10 ENCOUNTER — Ambulatory Visit: Payer: Medicare Other | Admitting: Physical Therapy

## 2015-04-10 DIAGNOSIS — M25511 Pain in right shoulder: Secondary | ICD-10-CM

## 2015-04-10 DIAGNOSIS — M25611 Stiffness of right shoulder, not elsewhere classified: Secondary | ICD-10-CM

## 2015-04-10 NOTE — Therapy (Signed)
[facility] [place], [place], [address] Phone: [phone]   Fax:  [phone]  Physical Therapy Treatment  Patient Details  Name: Ricky Holden MRN: [id number] Date of Birth: 1932-12-19 No Data Recorded  Encounter Date: 04/10/2015      PT End of Session - 04/10/15 1152    Visit Number 10   Number of Visits 12   PT Start Time 8338   PT Stop Time 1155   PT Time Calculation (min) 41 min   Activity Tolerance Patient tolerated treatment well   Behavior During Therapy [facility] for tasks assessed/performed      Past Medical History  Diagnosis Date  . Cancer [facility])     Prostate cancer - radiation  . Hypertension   . High cholesterol   . Arthritis   . Bell's palsy   . Cataract     has had surgery  . Diabetes mellitus without complication ([place])     type 2  . Chronic kidney disease     Cr 1.40 - 2.0 from ~ 2010 - 2015    Past Surgical History  Procedure Laterality Date  . Eye surgery Left     cataract surgery with lens implant  . Tonsillectomy    . Tympanoplasty Left     wears hearing aids  . Colonoscopy    . Anterior cervical decomp/discectomy fusion N/A 01/24/2014    Procedure: ANTERIOR CERVICAL DECOMPRESSION/DISCECTOMY FUSION 3 LEVELS Cervical three/four,four/five, five/six  anterior cervical decompression with fusion interbody prosthesis plating and bonegraft;  Surgeon: Newman Pies, MD;  Location: [place] NEUR[facility] ORS;  Service: Neurosurgery;  Laterality: N/A;    There were no vitals filed for this visit.  Visit Diagnosis:  Shoulder stiffness, right  Right shoulder pain      Subjective Assessment - 04/10/15 1135    Subjective patient reported pain comes and goes, went to MD and is to cont therapy, patient has no order to cont   Patient Stated Goals Use right UE without pain.   Currently in Pain? Yes   Pain Score 6    Pain Location Shoulder   Pain Orientation Right   Pain Descriptors / Indicators Aching    Pain Type Surgical pain   Pain Onset More than a month ago   Pain Frequency Constant   Aggravating Factors  overhead movement   Pain Relieving Factors rest            [facility] PT Assessment - 04/10/15 0001    AROM   Overall AROM  Within functional limits for tasks performed;Deficits   AROM Assessment Site Shoulder   Right/Left Shoulder Right   Right Shoulder Flexion 115 Degrees   Right Shoulder External Rotation 70 Degrees   PROM   Overall PROM  Within functional limits for tasks performed;Deficits   PROM Assessment Site Shoulder   Right/Left Shoulder Right   Right Shoulder Flexion 140 Degrees   Right Shoulder External Rotation 75 Degrees                     [facility] Adult PT Treatment/Exercise - 04/10/15 0001    Shoulder Exercises: Supine   Other Supine Exercises supine cane for flexion 2x10   Shoulder Exercises: Seated   Other Seated Exercises rw4 with yellow t-band 3x10 each   Shoulder Exercises: Pulleys   Flexion --  10min   Shoulder Exercises: ROM/Strengthening   UBE (Upper Arm Bike) 7min @ 120 RPM   Manual Therapy   Manual Therapy  Passive ROM   Manual therapy comments Right shoulder PROM into flexiion and ER iwith genlte range, then rhythnic stabs for flex/ext at 90 degrees and IR/ER in scaption                  PT Short Term Goals - 02/17/15 0914    PT SHORT TERM GOAL #1   Title Independent with an HEP.   Time 2   Period Weeks   Status Achieved           PT Long Term Goals - 04/10/15 1158    PT LONG TERM GOAL #1   Title Active right shoulder flexion to 145 degrees so the patient can easily reach overhead   Time 4   Period Weeks   Status On-going  AROM 115, PROM 140 degrees (04/10/15)   PT LONG TERM GOAL #2   Title Active ER to 70 degrees+ to allow for easily donning/doffing of apparel   Time 4   Period Weeks   Status Achieved   PT LONG TERM GOAL #3   Title Increase ROM so patient is able to reach behind back to L3 with right  hand.   Time 4   Period Weeks   Status On-going   PT LONG TERM GOAL #4   Title Increase shoulder strength to a solid 4+/5 to increase stability for performance of functional activities   Time 4   Period Weeks   Status On-going   PT LONG TERM GOAL #5   Title Perform ADL's with pain not > 3/10.   Time 4   Period Weeks   Status On-going  6/10 pain with ADL's (04/10/15)   PT LONG TERM GOAL #6   Title Ind with HEP.   Time 4   Period Weeks   Status On-going               Plan - 04/10/15 1200    Clinical Impression Statement patent progressing with all acivities. patient has improved active and passive rom for shouler flexion today. patient went to md and is to continue therapy. patient was not given new order. goals ongoing due to strength, pain and rom deficits.    Pt will benefit from skilled therapeutic intervention in order to improve on the following deficits Pain;Decreased activity tolerance;Decreased range of motion   Rehab Potential Good   PT Frequency 3x / week   PT Duration 4 weeks   PT Treatment/Interventions ADLs/Self Care Home Management;Electrical Stimulation;Cryotherapy;Ultrasound;Therapeutic activities;Therapeutic exercise;Manual techniques;Patient/family education;Neuromuscular re-education;Vasopneumatic Device   PT Next Visit Plan Cont with POC and progress per MPT with ROM and strength   Consulted and Agree with Plan of Care Patient        Problem List Patient Active Problem List   Diagnosis Date Noted  . Spondylolisthesis of lumbar region 06/30/2014  . Cervical spondylosis with myelopathy 01/24/2014  Ladean Raya, PTA 04/10/2015 12:08 PM   Caidyn Blossom, Margreta Journey P 04/10/2015, 12:08 PM  [facility] [address] [place], [place], [address] Phone: [phone]   Fax:  [phone]  Name: Ricky Holden MRN: [id number] Date of Birth: [birth year]

## 2015-04-10 NOTE — Therapy (Signed)
[facility] [place], [place], [address] Phone: [phone]   Fax:  [phone]  Physical Therapy Treatment  Patient Details  Name: Ricky Holden MRN: [id number] Date of Birth: 02-20-1933 No Data Recorded  Encounter Date: 04/10/2015      PT End of Session - 04/10/15 1152    Visit Number 10   Number of Visits 12   PT Start Time 6295   PT Stop Time 1155   PT Time Calculation (min) 41 min   Activity Tolerance Patient tolerated treatment well   Behavior During Therapy [facility] for tasks assessed/performed      Past Medical History  Diagnosis Date  . Cancer [facility])     Prostate cancer - radiation  . Hypertension   . High cholesterol   . Arthritis   . Bell's palsy   . Cataract     has had surgery  . Diabetes mellitus without complication ([place])     type 2  . Chronic kidney disease     Cr 1.40 - 2.0 from ~ 2010 - 2015    Past Surgical History  Procedure Laterality Date  . Eye surgery Left     cataract surgery with lens implant  . Tonsillectomy    . Tympanoplasty Left     wears hearing aids  . Colonoscopy    . Anterior cervical decomp/discectomy fusion N/A 01/24/2014    Procedure: ANTERIOR CERVICAL DECOMPRESSION/DISCECTOMY FUSION 3 LEVELS Cervical three/four,four/five, five/six  anterior cervical decompression with fusion interbody prosthesis plating and bonegraft;  Surgeon: Newman Pies, MD;  Location: [place] NEURO ORS;  Service: Neurosurgery;  Laterality: N/A;    There were no vitals filed for this visit.  Visit Diagnosis:  Shoulder stiffness, right  Right shoulder pain      Subjective Assessment - 04/10/15 1135    Subjective patient reported pain comes and goes, went to MD and is to cont therapy, patient has no order to cont   Patient Stated Goals Use right UE without pain.   Currently in Pain? Yes   Pain Score 6    Pain Location Shoulder   Pain Orientation Right   Pain Descriptors / Indicators Aching   Pain Type Surgical pain   Pain Onset More than a month ago   Pain Frequency Constant   Aggravating Factors  overhead movement   Pain Relieving Factors rest            [facility] PT Assessment - 04/10/15 0001    AROM   Overall AROM  Within functional limits for tasks performed;Deficits   AROM Assessment Site Shoulder   Right/Left Shoulder Right   Right Shoulder Flexion 115 Degrees   Right Shoulder External Rotation 70 Degrees   PROM   Overall PROM  Within functional limits for tasks performed;Deficits   PROM Assessment Site Shoulder   Right/Left Shoulder Right   Right Shoulder Flexion 140 Degrees   Right Shoulder External Rotation 75 Degrees                     [facility] Adult PT Treatment/Exercise - 04/10/15 0001    Shoulder Exercises: Supine   Other Supine Exercises supine cane for flexion 2x10   Shoulder Exercises: Seated   Other Seated Exercises rw4 with yellow t-band 3x10 each   Shoulder Exercises: Pulleys   Flexion --  73min   Shoulder Exercises: ROM/Strengthening   UBE (Upper Arm Bike) 7min @ 120 RPM   Manual Therapy   Manual Therapy Passive  ROM   Manual therapy comments Right shoulder PROM into flexiion and ER iwith genlte range, then rhythnic stabs for flex/ext at 90 degrees and IR/ER in scaption                  PT Short Term Goals - 02/17/15 0914    PT SHORT TERM GOAL #1   Title Independent with an HEP.   Time 2   Period Weeks   Status Achieved           PT Long Term Goals - 05/01/15 1158    PT LONG TERM GOAL #1   Title Active right shoulder flexion to 145 degrees so the patient can easily reach overhead   Time 4   Period Weeks   Status On-going  AROM 115, PROM 140 degrees (05/01/15)   PT LONG TERM GOAL #2   Title Active ER to 70 degrees+ to allow for easily donning/doffing of apparel   Time 4   Period Weeks   Status Achieved   PT LONG TERM GOAL #3   Title Increase ROM so patient is able to reach behind back to L3 with right  hand.   Time 4   Period Weeks   Status On-going   PT LONG TERM GOAL #4   Title Increase shoulder strength to a solid 4+/5 to increase stability for performance of functional activities   Time 4   Period Weeks   Status On-going   PT LONG TERM GOAL #5   Title Perform ADL's with pain not > 3/10.   Time 4   Period Weeks   Status On-going  6/10 pain with ADL's (05-01-2015)   PT LONG TERM GOAL #6   Title Ind with HEP.   Time 4   Period Weeks   Status On-going               Plan - 05-01-2015 1200    Clinical Impression Statement patent progressing with all acivities. patient has improved active and passive rom for shouler flexion today. patient went to md and is to continue therapy. patient was not given new order. goals ongoing due to strength, pain and rom deficits.    Pt will benefit from skilled therapeutic intervention in order to improve on the following deficits Pain;Decreased activity tolerance;Decreased range of motion   Rehab Potential Good   PT Frequency 3x / week   PT Duration 4 weeks   PT Treatment/Interventions ADLs/Self Care Home Management;Electrical Stimulation;Cryotherapy;Ultrasound;Therapeutic activities;Therapeutic exercise;Manual techniques;Patient/family education;Neuromuscular re-education;Vasopneumatic Device   PT Next Visit Plan Cont with POC and progress per MPT with ROM and strength   Consulted and Agree with Plan of Care Patient          G-Codes - 05/01/15 1511    Functional Assessment Tool Used Clinical judgement.   Functional Limitation Mobility: Walking and moving around   Mobility: Walking and Moving Around Current Status [phone]) At least 60 percent but less than 80 percent impaired, limited or restricted   Mobility: Walking and Moving Around Goal Status [phone]) At least 20 percent but less than 40 percent impaired, limited or restricted      Problem List Patient Active Problem List   Diagnosis Date Noted  . Spondylolisthesis of lumbar  region 06/30/2014  . Cervical spondylosis with myelopathy 01/24/2014    [name], [place] MPT May 01, 2015, 3:12 PM  [facility] [place], [place], [address] Phone: [phone]   Fax:  [phone]  Name: Ricky Holden MRN:  [id number] Date of Birth: 1933-05-14

## 2015-04-12 ENCOUNTER — Encounter: Payer: Self-pay | Admitting: Physical Therapy

## 2015-04-12 ENCOUNTER — Ambulatory Visit: Payer: Medicare Other | Admitting: Physical Therapy

## 2015-04-12 DIAGNOSIS — M25511 Pain in right shoulder: Secondary | ICD-10-CM

## 2015-04-12 DIAGNOSIS — M25611 Stiffness of right shoulder, not elsewhere classified: Secondary | ICD-10-CM | POA: Diagnosis not present

## 2015-04-12 NOTE — Therapy (Signed)
[facility] [place], [place], [address] Phone: [phone]   Fax:  [phone]  Physical Therapy Treatment  Patient Details  Name: Ricky Holden MRN: [id number] Date of Birth: 04/21/1933 No Data Recorded  Encounter Date: 04/12/2015      PT End of Session - 04/12/15 1143    Visit Number 11   Number of Visits 12   Date for PT Re-Evaluation 05/09/15   PT Start Time 1113   PT Stop Time 1154   PT Time Calculation (min) 41 min   Activity Tolerance Patient tolerated treatment well   Behavior During Therapy [facility] for tasks assessed/performed      Past Medical History  Diagnosis Date  . Cancer [facility])     Prostate cancer - radiation  . Hypertension   . High cholesterol   . Arthritis   . Bell's palsy   . Cataract     has had surgery  . Diabetes mellitus without complication ([place])     type 2  . Chronic kidney disease     Cr 1.40 - 2.0 from ~ 2010 - 2015    Past Surgical History  Procedure Laterality Date  . Eye surgery Left     cataract surgery with lens implant  . Tonsillectomy    . Tympanoplasty Left     wears hearing aids  . Colonoscopy    . Anterior cervical decomp/discectomy fusion N/A 01/24/2014    Procedure: ANTERIOR CERVICAL DECOMPRESSION/DISCECTOMY FUSION 3 LEVELS Cervical three/four,four/five, five/six  anterior cervical decompression with fusion interbody prosthesis plating and bonegraft;  Surgeon: Newman Pies, MD;  Location: [place] [facility];  Service: Neurosurgery;  Laterality: N/A;    There were no vitals filed for this visit.  Visit Diagnosis:  Shoulder stiffness, right  Right shoulder pain      Subjective Assessment - 04/12/15 1118    Subjective patient repoted feeling some better in shoulder, awaiting N.O.   Patient Stated Goals Use right UE without pain.   Currently in Pain? Yes   Pain Score 5    Pain Location Shoulder   Pain Orientation Right   Pain Descriptors / Indicators Aching   Pain Type Surgical pain   Pain Onset More than a month ago   Pain Frequency Constant   Aggravating Factors  overhead reaching   Pain Relieving Factors rest            [facility] PT Assessment - 04/12/15 0001    AROM   Overall AROM  Within functional limits for tasks performed;Deficits   AROM Assessment Site Shoulder   Right/Left Shoulder Right   Right Shoulder Flexion 115 Degrees   Right Shoulder External Rotation 70 Degrees   PROM   Overall PROM  Within functional limits for tasks performed;Deficits   PROM Assessment Site Shoulder   Right/Left Shoulder Right                     [facility] Adult PT Treatment/Exercise - 04/12/15 0001    Shoulder Exercises: Seated   Flexion Both;AAROM  with cane 2x10   Other Seated Exercises rw4 with yellow t-band 3x10 each   Shoulder Exercises: Standing   Other Standing Exercises wall walk x70min   Shoulder Exercises: Pulleys   Other Pulley Exercises UE ranger for elevation 3x10   Shoulder Exercises: ROM/Strengthening   UBE (Upper Arm Bike) 51min @ 90 RPM   Manual Therapy   Manual Therapy Passive ROM   Manual therapy comments Right shoulder PROM  into flexiion with low load holds                  PT Short Term Goals - 02/17/15 0914    PT SHORT TERM GOAL #1   Title Independent with an HEP.   Time 2   Period Weeks   Status Achieved           PT Long Term Goals - 04/10/15 1158    PT LONG TERM GOAL #1   Title Active right shoulder flexion to 145 degrees so the patient can easily reach overhead   Time 4   Period Weeks   Status On-going  AROM 115, PROM 140 degrees (04/10/15)   PT LONG TERM GOAL #2   Title Active ER to 70 degrees+ to allow for easily donning/doffing of apparel   Time 4   Period Weeks   Status Achieved   PT LONG TERM GOAL #3   Title Increase ROM so patient is able to reach behind back to L3 with right hand.   Time 4   Period Weeks   Status On-going   PT LONG TERM GOAL #4   Title Increase  shoulder strength to a solid 4+/5 to increase stability for performance of functional activities   Time 4   Period Weeks   Status On-going   PT LONG TERM GOAL #5   Title Perform ADL's with pain not > 3/10.   Time 4   Period Weeks   Status On-going  6/10 pain with ADL's (04/10/15)   PT LONG TERM GOAL #6   Title Ind with HEP.   Time 4   Period Weeks   Status On-going               Plan - 04/12/15 1144    Clinical Impression Statement Patient progressing with all activities today. Patient has decreased pain, improved ROM and progressing with strengthening for right shoulder. Patient goals ongoing due to full active ROM, strength deficits and pain in right shoulder.   Pt will benefit from skilled therapeutic intervention in order to improve on the following deficits Pain;Decreased activity tolerance;Decreased range of motion   Rehab Potential Good   PT Frequency 3x / week   PT Duration 4 weeks   PT Treatment/Interventions ADLs/Self Care Home Management;Electrical Stimulation;Cryotherapy;Ultrasound;Therapeutic activities;Therapeutic exercise;Manual techniques;Patient/family education;Neuromuscular re-education;Vasopneumatic Device   PT Next Visit Plan Cont with POC and progress per MPT with ROM and strength   Consulted and Agree with Plan of Care Patient        Problem List Patient Active Problem List   Diagnosis Date Noted  . Spondylolisthesis of lumbar region 06/30/2014  . Cervical spondylosis with myelopathy 01/24/2014    Rawson Minix P, PTA 04/12/2015, 11:58 AM  [facility] [place], [place], [address] Phone: [phone]   Fax:  [phone]  Name: Ricky Holden MRN: [id number] Date of Birth: 15-Jan-1933

## 2015-04-17 ENCOUNTER — Ambulatory Visit: Payer: Medicare Other | Admitting: Physical Therapy

## 2015-04-17 ENCOUNTER — Encounter: Payer: Self-pay | Admitting: Physical Therapy

## 2015-04-17 DIAGNOSIS — M25611 Stiffness of right shoulder, not elsewhere classified: Secondary | ICD-10-CM | POA: Diagnosis not present

## 2015-04-17 DIAGNOSIS — M25511 Pain in right shoulder: Secondary | ICD-10-CM

## 2015-04-17 NOTE — Therapy (Signed)
[facility] [place], [place], [address] Phone: [phone]   Fax:  [phone]  Physical Therapy Treatment  Patient Details  Name: Ricky Holden MRN: [id number] Date of Birth: 09/11/1932 No Data Recorded  Encounter Date: 04/17/2015      PT End of Session - 04/17/15 1428    Visit Number 12   Number of Visits 24   Date for PT Re-Evaluation 06/10/15  new order per MD to cont   PT Start Time 1353   PT Stop Time 1433   PT Time Calculation (min) 40 min   Activity Tolerance Patient tolerated treatment well   Behavior During Therapy [facility] for tasks assessed/performed      Past Medical History  Diagnosis Date  . Cancer [facility]     Prostate cancer - radiation  . Hypertension   . High cholesterol   . Arthritis   . Bell's palsy   . Cataract     has had surgery  . Diabetes mellitus without complication ([place])     type 2  . Chronic kidney disease     Cr 1.40 - 2.0 from ~ 2010 - 2015    Past Surgical History  Procedure Laterality Date  . Eye surgery Left     cataract surgery with lens implant  . Tonsillectomy    . Tympanoplasty Left     wears hearing aids  . Colonoscopy    . Anterior cervical decomp/discectomy fusion N/A 01/24/2014    Procedure: ANTERIOR CERVICAL DECOMPRESSION/DISCECTOMY FUSION 3 LEVELS Cervical three/four,four/five, five/six  anterior cervical decompression with fusion interbody prosthesis plating and bonegraft;  Surgeon: Newman Pies, MD;  Location: [place] [facility];  Service: Neurosurgery;  Laterality: N/A;    There were no vitals filed for this visit.  Visit Diagnosis:  Shoulder stiffness, right  Right shoulder pain      Subjective Assessment - 04/17/15 1355    Subjective new order arrived for patient to continue therapy. some soreness in shoulder per patient.   Patient Stated Goals Use right UE without pain.   Currently in Pain? Yes   Pain Score 4    Pain Location Shoulder   Pain  Orientation Right   Pain Descriptors / Indicators Aching   Pain Type Surgical pain   Pain Onset More than a month ago   Pain Frequency Constant   Aggravating Factors  overhead reaching   Pain Relieving Factors rest            [facility] PT Assessment - 04/17/15 0001    AROM   Overall AROM  Within functional limits for tasks performed;Deficits   AROM Assessment Site Shoulder   Right/Left Shoulder Right   Right Shoulder Flexion 105 Degrees  AAROM 125   Right Shoulder External Rotation 70 Degrees   PROM   Overall PROM  Within functional limits for tasks performed;Deficits   PROM Assessment Site Shoulder   Right/Left Shoulder Right   Right Shoulder Flexion 138 Degrees   Right Shoulder External Rotation 75 Degrees                     [facility] Adult PT Treatment/Exercise - 04/17/15 0001    Shoulder Exercises: Seated   Other Seated Exercises rw4 with yellow t-band 3x10 each   Shoulder Exercises: Standing   Other Standing Exercises wall walk x8min   Shoulder Exercises: Pulleys   Flexion --  33min   Other Pulley Exercises UE ranger for elevation 3x10   Shoulder Exercises:  ROM/Strengthening   UBE (Upper Arm Bike) 44min @ 90 RPM   Manual Therapy   Manual Therapy Passive ROM   Manual therapy comments Right shoulder PROM into flexiion with low load holds                  PT Short Term Goals - 02/17/15 0914    PT SHORT TERM GOAL #1   Title Independent with an HEP.   Time 2   Period Weeks   Status Achieved           PT Long Term Goals - 04/10/15 1158    PT LONG TERM GOAL #1   Title Active right shoulder flexion to 145 degrees so the patient can easily reach overhead   Time 4   Period Weeks   Status On-going  AROM 115, PROM 140 degrees (04/10/15)   PT LONG TERM GOAL #2   Title Active ER to 70 degrees+ to allow for easily donning/doffing of apparel   Time 4   Period Weeks   Status Achieved   PT LONG TERM GOAL #3   Title Increase ROM so patient is  able to reach behind back to L3 with right hand.   Time 4   Period Weeks   Status On-going   PT LONG TERM GOAL #4   Title Increase shoulder strength to a solid 4+/5 to increase stability for performance of functional activities   Time 4   Period Weeks   Status On-going   PT LONG TERM GOAL #5   Title Perform ADL's with pain not > 3/10.   Time 4   Period Weeks   Status On-going  6/10 pain with ADL's (04/10/15)   PT LONG TERM GOAL #6   Title Ind with HEP.   Time 4   Period Weeks   Status On-going               Plan - 04/17/15 1429    Clinical Impression Statement Patient progressing slowly with right shoulder flexion this week, has not improved with ROM. Continue to educate patient on self stretching at home. unable to meet any further golas due to pain, ROM and strength deficits.   Pt will benefit from skilled therapeutic intervention in order to improve on the following deficits Pain;Decreased activity tolerance;Decreased range of motion   Rehab Potential Good   PT Frequency 3x / week   PT Duration 4 weeks   PT Treatment/Interventions ADLs/Self Care Home Management;Electrical Stimulation;Cryotherapy;Ultrasound;Therapeutic activities;Therapeutic exercise;Manual techniques;Patient/family education;Neuromuscular re-education;Vasopneumatic Device   PT Next Visit Plan Cont with POC and progress per MPT with ROM and strength   Consulted and Agree with Plan of Care Patient        Problem List Patient Active Problem List   Diagnosis Date Noted  . Spondylolisthesis of lumbar region 06/30/2014  . Cervical spondylosis with myelopathy 01/24/2014    Junie Engram P, PTA 04/17/2015, 2:37 PM  [facility] [place], [place], [address] Phone: [phone]   Fax:  [phone]  Name: Ricky Holden MRN: [id number] Date of Birth: Jan 23, 1933

## 2015-08-03 NOTE — Therapy (Signed)
[facility] [place], [place], [address] Phone: [phone]   Fax:  [phone]  Physical Therapy Treatment  Patient Details  Name: Ricky Holden MRN: [id number] Date of Birth: 1933-04-25 No Data Recorded  Encounter Date: 04/17/2015    Past Medical History  Diagnosis Date  . Cancer [facility])     Prostate cancer - radiation  . Hypertension   . High cholesterol   . Arthritis   . Bell's palsy   . Cataract     has had surgery  . Diabetes mellitus without complication ([place])     type 2  . Chronic kidney disease     Cr 1.40 - 2.0 from ~ 2010 - 2015    Past Surgical History  Procedure Laterality Date  . Eye surgery Left     cataract surgery with lens implant  . Tonsillectomy    . Tympanoplasty Left     wears hearing aids  . Colonoscopy    . Anterior cervical decomp/discectomy fusion N/A 01/24/2014    Procedure: ANTERIOR CERVICAL DECOMPRESSION/DISCECTOMY FUSION 3 LEVELS Cervical three/four,four/five, five/six  anterior cervical decompression with fusion interbody prosthesis plating and bonegraft;  Surgeon: Newman Pies, MD;  Location: [place] [facility];  Service: Neurosurgery;  Laterality: N/A;    There were no vitals filed for this visit.  Visit Diagnosis:  Shoulder stiffness, right  Right shoulder pain                                 PT Short Term Goals - 02/17/15 0914    PT SHORT TERM GOAL #1   Title Independent with an HEP.   Time 2   Period Weeks   Status Achieved           PT Long Term Goals - 04/10/15 1158    PT LONG TERM GOAL #1   Title Active right shoulder flexion to 145 degrees so the patient can easily reach overhead   Time 4   Period Weeks   Status On-going  AROM 115, PROM 140 degrees (04/10/15)   PT LONG TERM GOAL #2   Title Active ER to 70 degrees+ to allow for easily donning/doffing of apparel   Time 4   Period Weeks   Status Achieved   PT LONG TERM GOAL #3    Title Increase ROM so patient is able to reach behind back to L3 with right hand.   Time 4   Period Weeks   Status On-going   PT LONG TERM GOAL #4   Title Increase shoulder strength to a solid 4+/5 to increase stability for performance of functional activities   Time 4   Period Weeks   Status On-going   PT LONG TERM GOAL #5   Title Perform ADL's with pain not > 3/10.   Time 4   Period Weeks   Status On-going  6/10 pain with ADL's (04/10/15)   PT LONG TERM GOAL #6   Title Ind with HEP.   Time 4   Period Weeks   Status On-going               Problem List Patient Active Problem List   Diagnosis Date Noted  . Spondylolisthesis of lumbar region 06/30/2014  . Cervical spondylosis with myelopathy 01/24/2014   PHYSICAL THERAPY DISCHARGE SUMMARY  Visits from Start of Care: 17  Current functional level related to goals / functional outcomes: Please see above.  Remaining deficits: Continued loss of right shoulder strength and ROM.  Pain still rising to 6/10 as well.   Education / Equipment: HEP.  Plan: Patient agrees to discharge.  Patient goals were not met. Patient is being discharged due to meeting the stated rehab goals.  ?????      Kin Galbraith, [place] MPT 08/03/2015, 2:28 PM  [facility] [address] [place], [place], [address] Phone: [phone]   Fax:  [phone]  Name: Ricky Holden MRN: [id number] Date of Birth: 1932-09-02

## 2019-09-30 ENCOUNTER — Other Ambulatory Visit: Payer: Self-pay | Admitting: Anesthesiology

## 2019-09-30 DIAGNOSIS — M5412 Radiculopathy, cervical region: Secondary | ICD-10-CM

## 2019-10-27 ENCOUNTER — Other Ambulatory Visit: Payer: Self-pay

## 2019-10-27 ENCOUNTER — Ambulatory Visit
Admission: RE | Admit: 2019-10-27 | Discharge: 2019-10-27 | Disposition: A | Discharge status: Home / Self Care | Payer: Medicare Other | Source: Ambulatory Visit | Attending: Anesthesiology | Admitting: Anesthesiology

## 2019-10-27 DIAGNOSIS — M5412 Radiculopathy, cervical region: Secondary | ICD-10-CM

## 2021-07-20 ENCOUNTER — Emergency Department (HOSPITAL_COMMUNITY)
Admission: EM | Admit: 2021-07-20 | Discharge: 2021-07-21 | Disposition: A | Discharge status: Home / Self Care | Payer: Medicare Other | Attending: Student | Admitting: Student

## 2021-07-20 ENCOUNTER — Encounter (HOSPITAL_COMMUNITY): Payer: Self-pay

## 2021-07-20 ENCOUNTER — Emergency Department (HOSPITAL_COMMUNITY): Payer: Medicare Other

## 2021-07-20 ENCOUNTER — Other Ambulatory Visit: Payer: Self-pay

## 2021-07-20 DIAGNOSIS — Y9301 Activity, walking, marching and hiking: Secondary | ICD-10-CM | POA: Diagnosis not present

## 2021-07-20 DIAGNOSIS — S01112A Laceration without foreign body of left eyelid and periocular area, initial encounter: Secondary | ICD-10-CM | POA: Diagnosis not present

## 2021-07-20 DIAGNOSIS — S0181XA Laceration without foreign body of other part of head, initial encounter: Secondary | ICD-10-CM | POA: Insufficient documentation

## 2021-07-20 DIAGNOSIS — Z23 Encounter for immunization: Secondary | ICD-10-CM | POA: Insufficient documentation

## 2021-07-20 DIAGNOSIS — S0993XA Unspecified injury of face, initial encounter: Secondary | ICD-10-CM | POA: Diagnosis present

## 2021-07-20 DIAGNOSIS — R519 Headache, unspecified: Secondary | ICD-10-CM | POA: Diagnosis not present

## 2021-07-20 DIAGNOSIS — W19XXXA Unspecified fall, initial encounter: Secondary | ICD-10-CM

## 2021-07-20 DIAGNOSIS — W01198A Fall on same level from slipping, tripping and stumbling with subsequent striking against other object, initial encounter: Secondary | ICD-10-CM | POA: Diagnosis not present

## 2021-07-20 MED ORDER — LIDOCAINE HCL (PF) 1 % IJ SOLN
INTRAMUSCULAR | Status: AC
  Administered 2021-07-20: 1 mL
  Filled 2021-07-20: qty 5

## 2021-07-20 MED ORDER — TETANUS-DIPHTH-ACELL PERTUSSIS 5-2.5-18.5 LF-MCG/0.5 IM SUSY
0.5000 mL | PREFILLED_SYRINGE | Freq: Once | INTRAMUSCULAR | Status: AC
  Administered 2021-07-20: 0.5 mL via INTRAMUSCULAR
  Filled 2021-07-20: qty 0.5

## 2021-07-20 MED ORDER — LIDOCAINE HCL (PF) 1 % IJ SOLN: 5.0000 mL | Freq: Once | INTRAMUSCULAR | Status: AC

## 2021-07-20 NOTE — ED Provider Notes (Signed)
[facility] EMERGENCY DEPARTMENT Provider Note   CSN: [id number] Arrival date & time: 07/20/21  2034     History  Chief Complaint  Patient presents with   Ricky Holden    Ricky Holden is a 88 y.o. male here for evaluation after mechanical fall.  States walks with a walker at baseline.  Turned around too quickly, lost his footing subsequently falling to the left hitting his head.  He denies any vision changes.  Has laceration to left eyebrow.  Mild headache.  He was amatory with walker prior to arrival.  He denies any pain to his neck, back, upper extremities, lower extremities.  No chest pain, shortness of breath, abdominal pain.  States he does have chronic back pain from prior surgical procedures sleeps on a recliner at baseline due to chronic back pain.  Denies any PND or orthopnea.  Thinks he is on a blood thinner however does not know the name of this. Unknown tetanus  HPI     Home Medications Prior to Admission medications   Medication Sig Start Date End Date Taking? Authorizing Provider  allopurinol (ZYLOPRIM) 300 MG tablet Take 300 mg by mouth daily.    [provider]  atenolol-chlorthalidone (TENORETIC) 50-25 MG per tablet Take 0.5 tablets by mouth daily.    [provider]  atorvastatin (LIPITOR) 40 MG tablet Take 20 mg by mouth daily.    [provider]  diazepam (VALIUM) 2 MG tablet Take 1 tablet (2 mg total) by mouth every 6 (six) hours as needed for muscle spasms. 07/02/14   Newman Pies, MD  docusate sodium 100 MG CAPS Take 100 mg by mouth 2 (two) times daily. Patient not taking: Reported on 06/22/2014 01/26/14   Newman Pies, MD  docusate sodium 100 MG CAPS Take 100 mg by mouth 2 (two) times daily. 07/02/14   Newman Pies, MD  fenofibrate 160 MG tablet Take 160 mg by mouth daily.    [provider]  linagliptin (TRADJENTA) 5 MG TABS tablet Take 5 mg by mouth daily.    [provider]  oxyCODONE-acetaminophen  (PERCOCET/ROXICET) 5-325 MG per tablet Take 1-2 tablets by mouth every 4 (four) hours as needed for moderate pain. 07/02/14   Newman Pies, MD  rOPINIRole (REQUIP) 0.5 MG tablet Take 0.5 mg by mouth at bedtime.    [provider]  tamsulosin (FLOMAX) 0.4 MG CAPS capsule Take 1 capsule (0.4 mg total) by mouth daily. Patient not taking: Reported on 01/17/2015 07/02/14   Newman Pies, MD      Allergies    Patient has no known allergies.    Review of Systems   Review of Systems  Constitutional: Negative.   HENT: Negative.    Respiratory: Negative.    Cardiovascular: Negative.   Gastrointestinal: Negative.   Genitourinary: Negative.   Musculoskeletal: Negative.   Skin:  Positive for wound.  All other systems reviewed and are negative.  Physical Exam Updated Vital Signs BP 126/72    Pulse 82    Temp 98.4 F (36.9 C) (Oral)    Resp 15    Ht 5\' 8"  (1.727 m)    Wt 70.3 kg    SpO2 100%    BMI 23.57 kg/m  Physical Exam Vitals and nursing note reviewed.  Constitutional:      General: He is not in acute distress.    Appearance: He is well-developed. He is not ill-appearing or diaphoretic.  HENT:     Head: Normocephalic.  Jaw: There is normal jaw occlusion.      Comments: 1.5 cm laceration to left inferior aspect eyebrow    Nose: Nose normal. No congestion or rhinorrhea.     Mouth/Throat:     Mouth: Mucous membranes are moist.  Eyes:     General: Lids are everted, no foreign bodies appreciated.     Extraocular Movements: Extraocular movements intact.     Conjunctiva/sclera: Conjunctivae normal.     Pupils: Pupils are equal, round, and reactive to light.     Visual Fields: Right eye visual fields normal and left eye visual fields normal.      Comments: EOMs intact, no conjunctival injection, erythema, bleeding.  No traumatic hyphema.  Mild soft tissue swelling about left superior eyelid  Neck:     Trachea: Trachea and phonation normal.     Comments: No midline  tenderness, full range of motion Cardiovascular:     Rate and Rhythm: Normal rate and regular rhythm.     Pulses: Normal pulses.          Radial pulses are 2+ on the right side and 2+ on the left side.     Heart sounds: Normal heart sounds.  Pulmonary:     Effort: Pulmonary effort is normal. No respiratory distress.     Breath sounds: Normal breath sounds and air entry.  Chest:     Comments: Nontender, no crepitus or step-off Abdominal:     General: Bowel sounds are normal. There is no distension.     Palpations: Abdomen is soft.     Tenderness: There is no abdominal tenderness.     Comments: Soft, nontender  Musculoskeletal:        General: Normal range of motion.     Cervical back: Full passive range of motion without pain, normal range of motion and neck supple.     Comments: No midline C/T/L tenderness, no bony tenderness bilateral upper and lower extremities.  Full range of motion without difficulty  Skin:    General: Skin is warm and dry.     Capillary Refill: Capillary refill takes less than 2 seconds.     Findings: Laceration present.     Comments: 1.5 cm laceration to left inferior aspect eyebrow, upper eyelid.  Neurological:     General: No focal deficit present.     Mental Status: He is alert and oriented to person, place, and time.    ED Results / Procedures / Treatments   Labs (all labs ordered are listed, but only abnormal results are displayed) Labs Reviewed - No data to display  EKG None  Radiology CT Head Wo Contrast  Result Date: 07/20/2021 CLINICAL DATA:  Facial trauma. EXAM: CT HEAD WITHOUT CONTRAST CT MAXILLOFACIAL WITHOUT CONTRAST CT CERVICAL SPINE WITHOUT CONTRAST TECHNIQUE: Multidetector CT imaging of the head, cervical spine, and maxillofacial structures were performed using the standard protocol without intravenous contrast. Multiplanar CT image reconstructions of the cervical spine and maxillofacial structures were also generated. RADIATION DOSE  REDUCTION: This exam was performed according to the departmental dose-optimization program which includes automated exposure control, adjustment of the mA and/or kV according to patient size and/or use of iterative reconstruction technique. COMPARISON:  None. FINDINGS: CT HEAD FINDINGS Brain: No evidence of acute infarction, hemorrhage, hydrocephalus, extra-axial collection or mass lesion/mass effect. There is mild diffuse atrophy. Vascular: Atherosclerotic calcifications are present within the cavernous internal carotid arteries. Skull: Normal. Negative for fracture or focal lesion. Other: None. CT MAXILLOFACIAL FINDINGS Osseous: No fracture or  mandibular dislocation. No destructive process. Chronic appearing nasal septal deviation to the right. Orbits: Negative. No traumatic or inflammatory finding. Sinuses: Clear. Soft tissues: There is soft tissue swelling lateral the left orbit. CT CERVICAL SPINE FINDINGS Alignment: Normal. Skull base and vertebrae: No evidence for fracture. C3-C6 anterior fusion hardware appears uncomplicated. Soft tissues and spinal canal: No prevertebral fluid or swelling. No visible canal hematoma. Disc levels: There is disc space narrowing at C6-C7 with endplate osteophyte formation compatible with degenerative change. There also degenerative changes of facet joints, right greater than left. Severe right neural foraminal stenosis is noted at C3-C4. Severe left-sided neural foraminal stenosis is noted at C5-C6. Calcified disc bulge causes moderate central canal stenosis at C6-C7 and mild central canal stenosis at C5-C6. Upper chest: Negative. Other: None. IMPRESSION: 1. No acute intracranial process. 2. No acute facial fracture. 3. Left facial soft tissue swelling. 4. No acute fracture or subluxation of the cervical spine. 5. C3-C6 anterior fusion appears uncomplicated. Electronically Signed   By: Ronney Asters M.D.   On: 07/20/2021 23:17   CT Cervical Spine Wo Contrast  Result Date:  07/20/2021 CLINICAL DATA:  Facial trauma. EXAM: CT HEAD WITHOUT CONTRAST CT MAXILLOFACIAL WITHOUT CONTRAST CT CERVICAL SPINE WITHOUT CONTRAST TECHNIQUE: Multidetector CT imaging of the head, cervical spine, and maxillofacial structures were performed using the standard protocol without intravenous contrast. Multiplanar CT image reconstructions of the cervical spine and maxillofacial structures were also generated. RADIATION DOSE REDUCTION: This exam was performed according to the departmental dose-optimization program which includes automated exposure control, adjustment of the mA and/or kV according to patient size and/or use of iterative reconstruction technique. COMPARISON:  None. FINDINGS: CT HEAD FINDINGS Brain: No evidence of acute infarction, hemorrhage, hydrocephalus, extra-axial collection or mass lesion/mass effect. There is mild diffuse atrophy. Vascular: Atherosclerotic calcifications are present within the cavernous internal carotid arteries. Skull: Normal. Negative for fracture or focal lesion. Other: None. CT MAXILLOFACIAL FINDINGS Osseous: No fracture or mandibular dislocation. No destructive process. Chronic appearing nasal septal deviation to the right. Orbits: Negative. No traumatic or inflammatory finding. Sinuses: Clear. Soft tissues: There is soft tissue swelling lateral the left orbit. CT CERVICAL SPINE FINDINGS Alignment: Normal. Skull base and vertebrae: No evidence for fracture. C3-C6 anterior fusion hardware appears uncomplicated. Soft tissues and spinal canal: No prevertebral fluid or swelling. No visible canal hematoma. Disc levels: There is disc space narrowing at C6-C7 with endplate osteophyte formation compatible with degenerative change. There also degenerative changes of facet joints, right greater than left. Severe right neural foraminal stenosis is noted at C3-C4. Severe left-sided neural foraminal stenosis is noted at C5-C6. Calcified disc bulge causes moderate central canal  stenosis at C6-C7 and mild central canal stenosis at C5-C6. Upper chest: Negative. Other: None. IMPRESSION: 1. No acute intracranial process. 2. No acute facial fracture. 3. Left facial soft tissue swelling. 4. No acute fracture or subluxation of the cervical spine. 5. C3-C6 anterior fusion appears uncomplicated. Electronically Signed   By: Ronney Asters M.D.   On: 07/20/2021 23:17   CT Maxillofacial Wo Contrast  Result Date: 07/20/2021 CLINICAL DATA:  Facial trauma. EXAM: CT HEAD WITHOUT CONTRAST CT MAXILLOFACIAL WITHOUT CONTRAST CT CERVICAL SPINE WITHOUT CONTRAST TECHNIQUE: Multidetector CT imaging of the head, cervical spine, and maxillofacial structures were performed using the standard protocol without intravenous contrast. Multiplanar CT image reconstructions of the cervical spine and maxillofacial structures were also generated. RADIATION DOSE REDUCTION: This exam was performed according to the departmental dose-optimization program which includes automated exposure  control, adjustment of the mA and/or kV according to patient size and/or use of iterative reconstruction technique. COMPARISON:  None. FINDINGS: CT HEAD FINDINGS Brain: No evidence of acute infarction, hemorrhage, hydrocephalus, extra-axial collection or mass lesion/mass effect. There is mild diffuse atrophy. Vascular: Atherosclerotic calcifications are present within the cavernous internal carotid arteries. Skull: Normal. Negative for fracture or focal lesion. Other: None. CT MAXILLOFACIAL FINDINGS Osseous: No fracture or mandibular dislocation. No destructive process. Chronic appearing nasal septal deviation to the right. Orbits: Negative. No traumatic or inflammatory finding. Sinuses: Clear. Soft tissues: There is soft tissue swelling lateral the left orbit. CT CERVICAL SPINE FINDINGS Alignment: Normal. Skull base and vertebrae: No evidence for fracture. C3-C6 anterior fusion hardware appears uncomplicated. Soft tissues and spinal canal: No  prevertebral fluid or swelling. No visible canal hematoma. Disc levels: There is disc space narrowing at C6-C7 with endplate osteophyte formation compatible with degenerative change. There also degenerative changes of facet joints, right greater than left. Severe right neural foraminal stenosis is noted at C3-C4. Severe left-sided neural foraminal stenosis is noted at C5-C6. Calcified disc bulge causes moderate central canal stenosis at C6-C7 and mild central canal stenosis at C5-C6. Upper chest: Negative. Other: None. IMPRESSION: 1. No acute intracranial process. 2. No acute facial fracture. 3. Left facial soft tissue swelling. 4. No acute fracture or subluxation of the cervical spine. 5. C3-C6 anterior fusion appears uncomplicated. Electronically Signed   By: Ronney Asters M.D.   On: 07/20/2021 23:17    Procedures .Marland KitchenLaceration Repair  Date/Time: 07/20/2021 10:35 PM Performed by: Shelby Dubin A, PA-C Authorized by: Nettie Elm, PA-C   Consent:    Consent obtained:  Verbal   Consent given by:  Patient   Risks, benefits, and alternatives were discussed: yes     Risks discussed:  Infection, pain, retained foreign body, poor cosmetic result, need for additional repair, nerve damage, poor wound healing, vascular damage and tendon damage   Alternatives discussed:  No treatment and delayed treatment Universal protocol:    Procedure explained and questions answered to patient or proxy's satisfaction: yes     Relevant documents present and verified: yes     Test results available: yes     Imaging studies available: yes     Required blood products, implants, devices, and special equipment available: yes     Site/side marked: yes     Immediately prior to procedure, a time out was called: yes     Patient identity confirmed:  Verbally with patient Anesthesia:    Anesthesia method:  Local infiltration   Local anesthetic:  Lidocaine 1% w/o epi Laceration details:    Location:  Face   Face  location:  L upper eyelid   Extent:  Superficial   Length (cm):  1.5   Depth (mm):  3 Pre-procedure details:    Preparation:  Patient was prepped and draped in usual sterile fashion and imaging obtained to evaluate for foreign bodies Exploration:    Limited defect created (wound extended): no     Hemostasis achieved with:  Direct pressure   Imaging obtained comment:  CT   Wound exploration: wound explored through full range of motion and entire depth of wound visualized     Wound extent: no areolar tissue violation noted, no fascia violation noted, no foreign bodies/material noted, no muscle damage noted, no nerve damage noted, no tendon damage noted, no underlying fracture noted and no vascular damage noted     Contaminated: no   Treatment:  Area cleansed with:  Povidone-iodine   Amount of cleaning:  Extensive   Irrigation solution:  Sterile saline   Irrigation method:  Pressure wash   Visualized foreign bodies/material removed: no   Skin repair:    Repair method:  Sutures   Suture size:  6-0   Suture material:  Prolene   Suture technique:  Simple interrupted   Number of sutures:  5 Approximation:    Approximation:  Close Repair type:    Repair type:  Intermediate Post-procedure details:    Dressing:  Open (no dressing)   Procedure completion:  Tolerated well, no immediate complications    Medications Ordered in ED Medications  lidocaine (PF) (XYLOCAINE) 1 % injection 5 mL (1 mL Infiltration Given 07/20/21 2217)  Tdap (BOOSTRIX) injection 0.5 mL (0.5 mLs Intramuscular Given 07/20/21 2301)    ED Course/ Medical Decision Making/ A&P    88 year old with multiple comorbidities here for evaluation after mechanical fall which occurred just PTA.  Patient states he turned too quickly lost his footing and tripped over his walker.  Hit left side of his head.  Does have 1.5 cm laceration to left lower eyebrow, upper lid.  He has full range of motion to his face.  Does have some soft  tissue swelling to his left upper eyelid however thankfully on exam no traumatic injuries to the orbits, no traumatic hyphema, PERRLA without difficulty to suggest entrapment, no conjunctival bleeding to suggest open globe injury.  Does admit to some chronic back pain however denies any C/C/L tenderness on exam.  Was ambulatory prior to arrival.  He does think he is on blood thinners however per chart review I do not see any.  He has no pulsatile bleeding to his laceration.  This was closed with #4 Prolene sutures without difficulty and without active bleeding.  He has no bony tenderness to extremities, will update tetanus.  He is neurovascularly intact.  Denies any presyncope, syncope symptoms, chest pain, shortness breath, abdominal pain, headache, weakness or numbness prior to his fall.  We will plan on head imaging and reassess  Ct imaging personally reviewed and interpreted: No significant findings  DC home with strict return precautions. Ambulatory  The patient has been appropriately medically screened and/or stabilized in the ED. I have low suspicion for any other emergent medical condition which would require further screening, evaluation or treatment in the ED or require inpatient management.  Patient is hemodynamically stable and in no acute distress.  Patient able to ambulate in department prior to ED.  Evaluation does not show acute pathology that would require ongoing or additional emergent interventions while in the emergency department or further inpatient treatment.  I have discussed the diagnosis with the patient and answered all questions.  Pain is been managed while in the emergency department and patient has no further complaints prior to discharge.  Patient is comfortable with plan discussed in room and is stable for discharge at this time.  I have discussed strict return precautions for returning to the emergency department.  Patient was encouraged to follow-up with PCP/specialist  refer to at discharge.                            Medical Decision Making Amount and/or Complexity of Data Reviewed Independent Historian: friend External Data Reviewed: labs, radiology and notes.    Details: Follows with [facility]. Hx of CKD, DM Radiology: ordered and independent interpretation performed. Decision-making details documented in  ED Course.  Risk OTC drugs. Prescription drug management. Parenteral controlled substances.         Final Clinical Impression(s) / ED Diagnoses Final diagnoses:  Fall, initial encounter  Facial laceration, initial encounter    Rx / DC Orders ED Discharge Orders     None         Kwesi Sangha A, PA-C 07/21/21 1404    Kommor, Debe Coder, MD 07/23/21 1607

## 2021-07-20 NOTE — ED Triage Notes (Addendum)
Pt arrives with c/o a fall. Pt fell and hit the left die of his head near his eye. Per pt, he thinks he takes a blood thinner, but there isn't a blood thinner in his med list. Pt has a lac on left eyebrow.

## 2021-07-20 NOTE — Discharge Instructions (Signed)
Laceration was closed today with #4 sutures.  You will need to have these removed in approximately 5 days.  You may have this done at primary care office, urgent care or back here in the emergency department  Make sure to use ice as you are likely to have swelling to your face.  It is normal to have bruising.  If you develop any vision changes, worsening headache, vomiting, numbness, weakness, bleeding to your eye itself please return to the emergency department  Otherwise help with your primary care provider

## 2022-07-11 IMAGING — CT CT HEAD W/O CM
3 series · 14 of 47 positions shown, 16 images · non-contrast
Comparison: None.

CLINICAL DATA: Facial trauma.



[Series 2: head w o · axial · 0.47mm/px · z∈[+1171,+1326]mm · 8 of 37 slices shown, 10 images]
[im 3/37  brain]
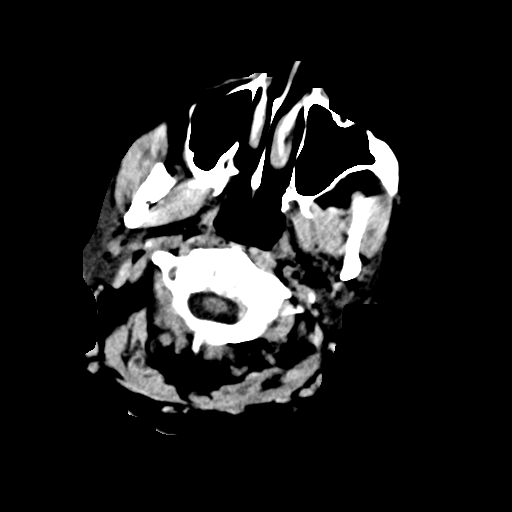
[im 3/37  bone]
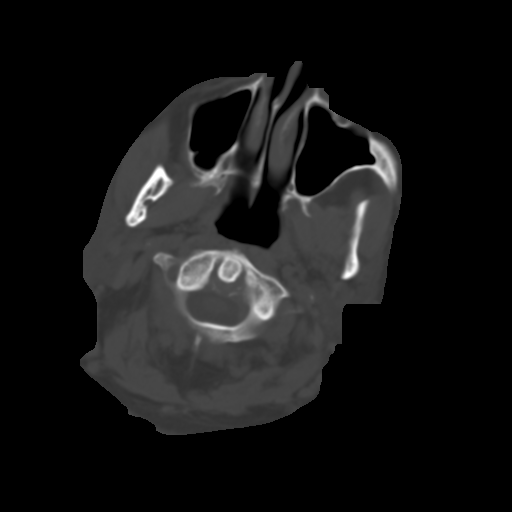
[im 8/37  brain]
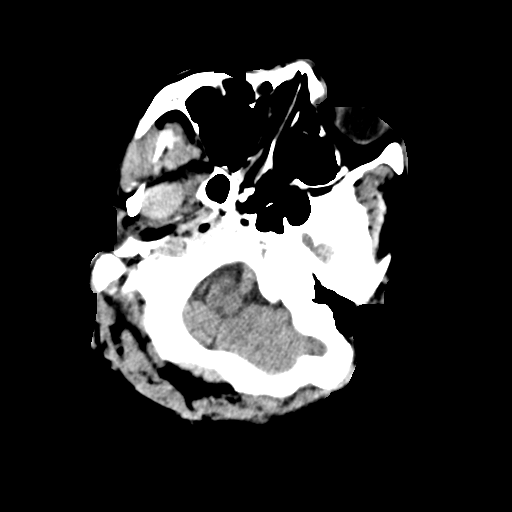
[im 12/37  brain]
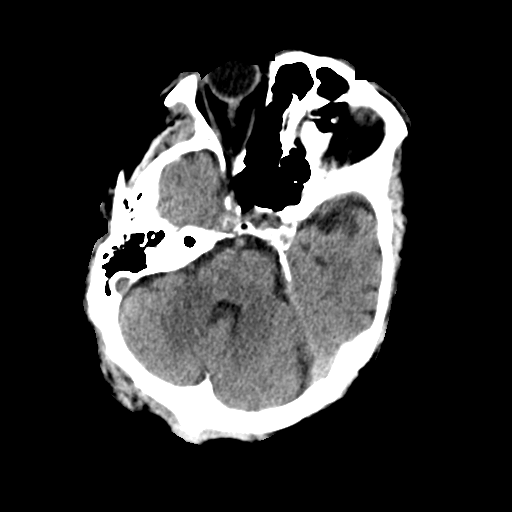
[im 17/37  brain]
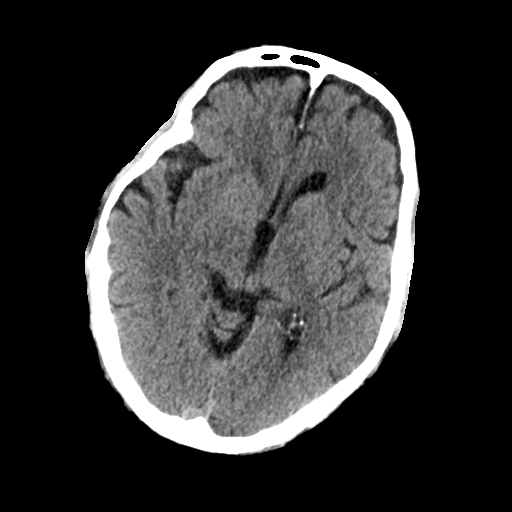
[im 20/37  brain]
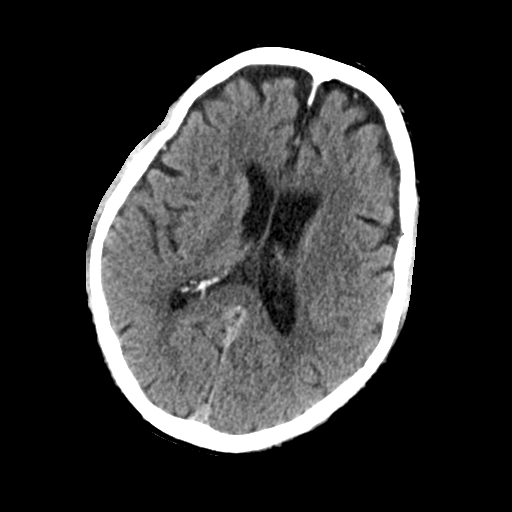
[im 20/37  bone]
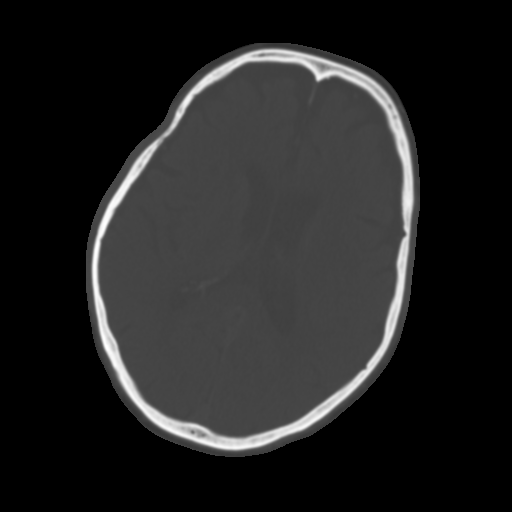
[im 25/37  brain]
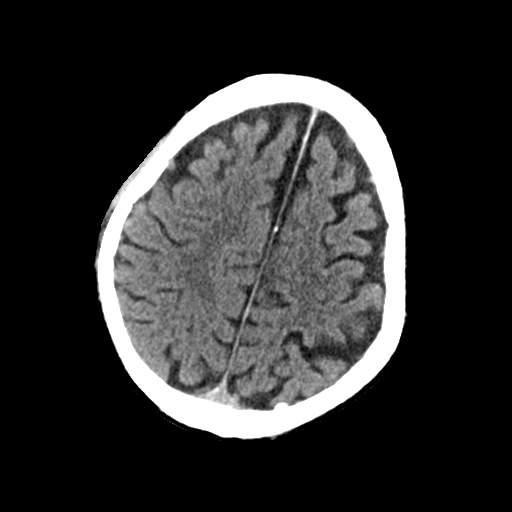
[im 29/37  brain]
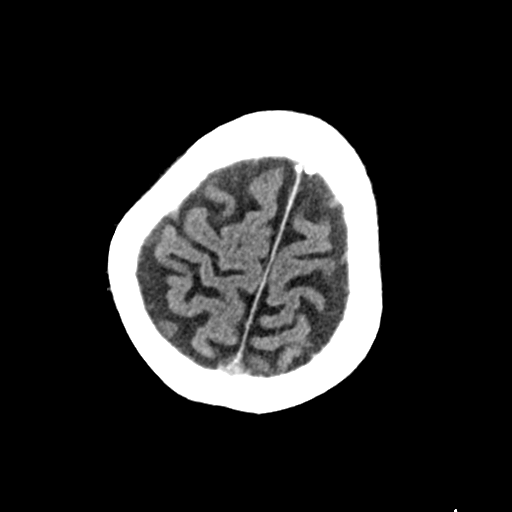
[im 34/37  brain]
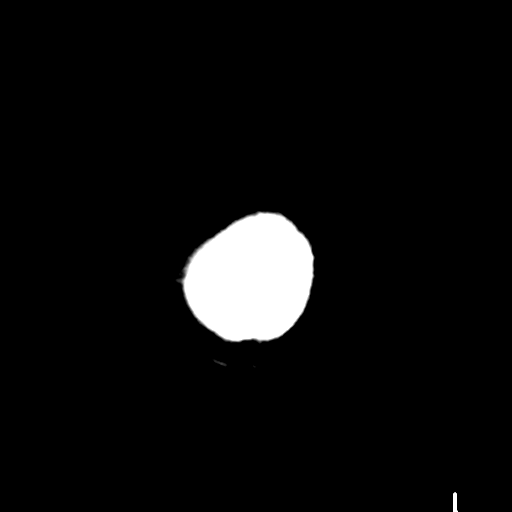

[Series 4: coronal soft · coronal · 0.34mm/px · 3 of 73 slices shown]
[im 25/73  brain]
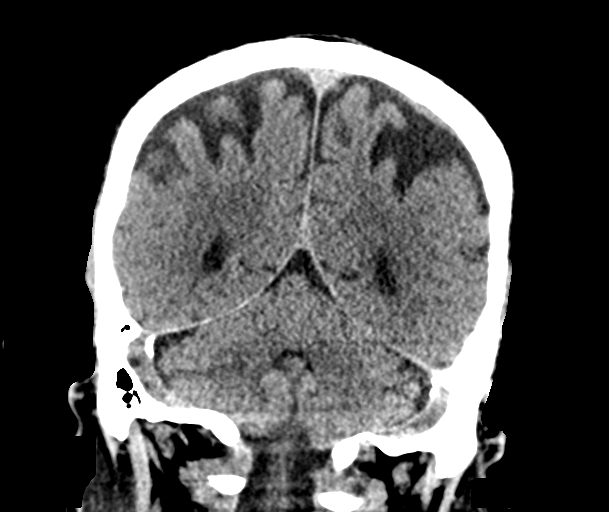
[im 33/73  brain]
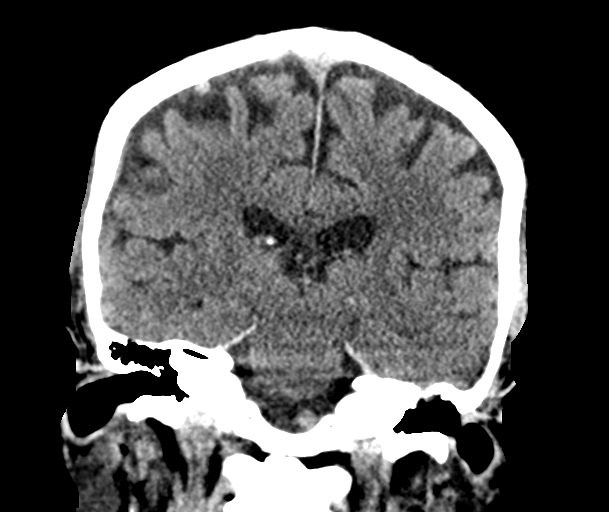
[im 41/73  brain]
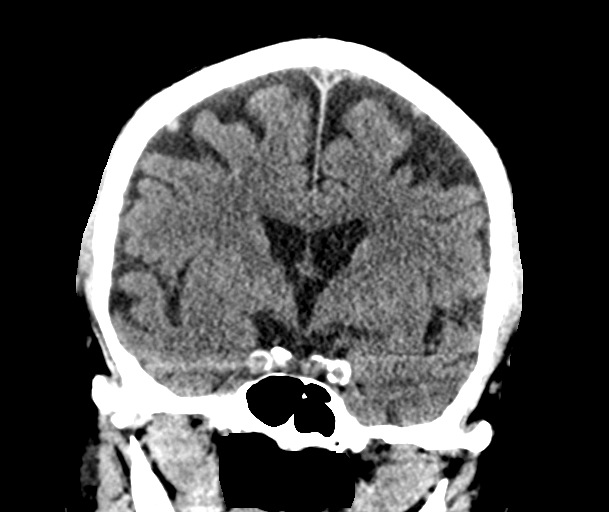

[Series 5: sagittal soft · sagittal · 0.36mm/px · 3 of 64 slices shown]
[im 22/64  brain]
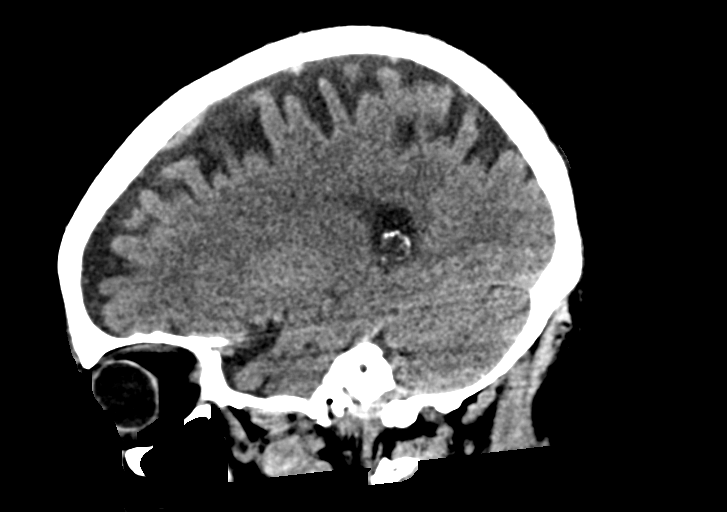
[im 32/64  brain]
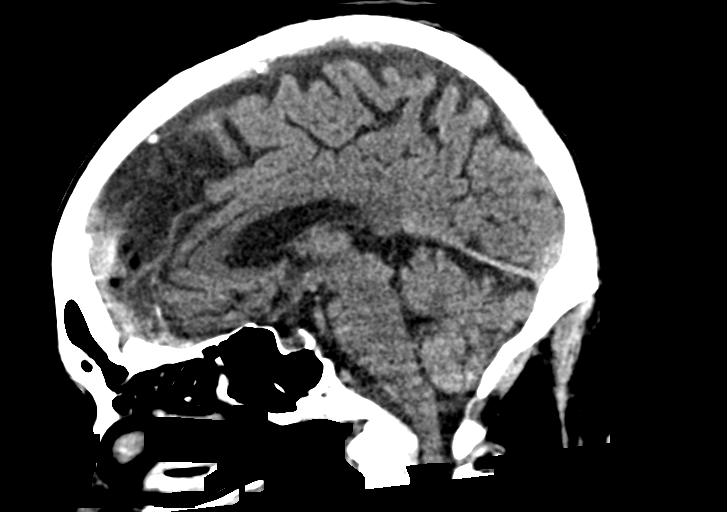
[im 43/64  brain]
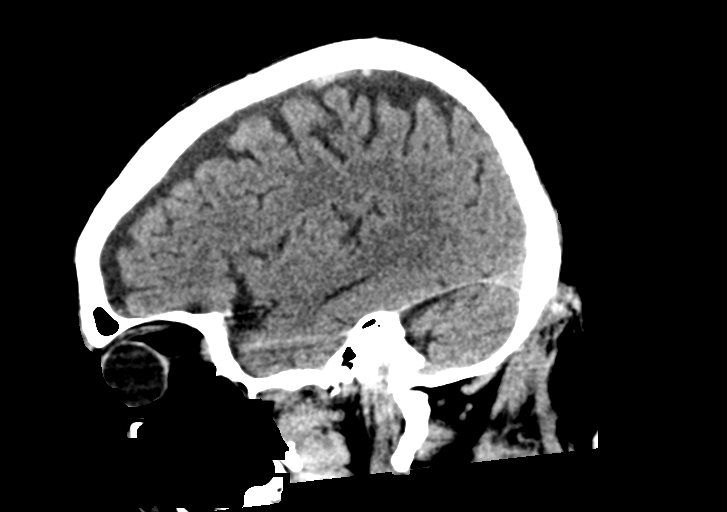

[14 of 47 positions shown; findings below may reference images not displayed]

FINDINGS: CT HEAD FINDINGS

Brain: No evidence of acute infarction, hemorrhage, hydrocephalus,
extra-axial collection or mass lesion/mass effect. There is mild
diffuse atrophy.

Vascular: Atherosclerotic calcifications are present within the
cavernous internal carotid arteries.

Skull: Normal. Negative for fracture or focal lesion.

Other: None.

CT MAXILLOFACIAL FINDINGS

Osseous: No fracture or mandibular dislocation. No destructive
process. Chronic appearing nasal septal deviation to the right.

Orbits: Negative. No traumatic or inflammatory finding.

Sinuses: Clear.

Soft tissues: There is soft tissue swelling lateral the left orbit.

CT CERVICAL SPINE FINDINGS

Alignment: Normal.

Skull base and vertebrae: No evidence for fracture. C3-C6 anterior
fusion hardware appears uncomplicated.

Soft tissues and spinal canal: No prevertebral fluid or swelling. No
visible canal hematoma.

Disc levels: There is disc space narrowing at C6-C7 with endplate
osteophyte formation compatible with degenerative change. There also
degenerative changes of facet joints, right greater than left.
Severe right neural foraminal stenosis is noted at C3-C4. Severe
left-sided neural foraminal stenosis is noted at C5-C6. Calcified
disc bulge causes moderate central canal stenosis at C6-C7 and mild
central canal stenosis at C5-C6.

Upper chest: Negative.

Other: None.
IMPRESSION: 1. No acute intracranial process.
2. No acute facial fracture.
3. Left facial soft tissue swelling.
4. No acute fracture or subluxation of the cervical spine.
5. C3-C6 anterior fusion appears uncomplicated.
